# Patient Record
Sex: Female | Born: 1960 | Race: White | Hispanic: No | Smoking: Former smoker
Health system: Southern US, Community
[De-identification: ages and names within clinical notes are randomized; demographics above are authoritative.]

## PROBLEM LIST (undated history)

## (undated) DIAGNOSIS — C4491 Basal cell carcinoma of skin, unspecified: Secondary | ICD-10-CM

## (undated) DIAGNOSIS — C439 Malignant melanoma of skin, unspecified: Secondary | ICD-10-CM

## (undated) DIAGNOSIS — K219 Gastro-esophageal reflux disease without esophagitis: Secondary | ICD-10-CM

## (undated) HISTORY — DX: Gastro-esophageal reflux disease without esophagitis: K21.9

## (undated) HISTORY — DX: Basal cell carcinoma of skin, unspecified: C44.91

## (undated) HISTORY — PX: TONSILECTOMY/ADENOIDECTOMY WITH MYRINGOTOMY: SHX6125

## (undated) HISTORY — PX: TONSILLECTOMY: SUR1361

## (undated) HISTORY — DX: Malignant melanoma of skin, unspecified: C43.9

---

## 2010-11-14 LAB — HM COLONOSCOPY

## 2014-03-15 LAB — HM HEPATITIS C SCREENING LAB: HM Hepatitis Screen: NEGATIVE

## 2016-11-08 DIAGNOSIS — M8580 Other specified disorders of bone density and structure, unspecified site: Secondary | ICD-10-CM | POA: Insufficient documentation

## 2017-11-25 DIAGNOSIS — B001 Herpesviral vesicular dermatitis: Secondary | ICD-10-CM | POA: Insufficient documentation

## 2018-12-01 LAB — HM PAP SMEAR: HM Pap smear: NEGATIVE

## 2018-12-01 LAB — LIPID PANEL
Cholesterol: 203 — AB (ref 0–200)
HDL: 108 — AB (ref 35–70)
LDL Cholesterol: 78
Triglycerides: 83 (ref 40–160)

## 2018-12-01 LAB — TSH: TSH: 3.39 (ref <=5.90)

## 2018-12-01 LAB — HM HIV SCREENING LAB: HM HIV Screening: NEGATIVE

## 2018-12-01 LAB — HIV ANTIBODY (ROUTINE TESTING W REFLEX): HIV 1&2 Ab, 4th Generation: NEGATIVE

## 2018-12-01 LAB — RESULTS CONSOLE HPV: CHL HPV: NEGATIVE

## 2019-11-10 LAB — HM MAMMOGRAPHY

## 2020-03-30 LAB — HEPATIC FUNCTION PANEL
ALT: 39 — AB (ref 7–35)
AST: 37 — AB (ref 13–35)
Alkaline Phosphatase: 49 (ref 25–125)

## 2020-03-30 LAB — BASIC METABOLIC PANEL
BUN: 15 (ref 4–21)
CO2: 30 — AB (ref 13–22)
Chloride: 101 (ref 99–108)
Creatinine: 0.9 (ref 0.5–1.1)
Glucose: 83
Potassium: 3.8 (ref 3.4–5.3)
Sodium: 139 (ref 137–147)

## 2020-03-30 LAB — CBC AND DIFFERENTIAL
Hemoglobin: 12.7 (ref 12.0–16.0)
Platelets: 180 (ref 150–399)
WBC: 5.7

## 2020-03-30 LAB — COMPREHENSIVE METABOLIC PANEL
Calcium: 9.8 (ref 8.7–10.7)
GFR calc non Af Amer: 69

## 2020-10-05 ENCOUNTER — Encounter: Payer: Self-pay | Admitting: Podiatry

## 2020-10-05 ENCOUNTER — Ambulatory Visit: Payer: BLUE CROSS/BLUE SHIELD | Admitting: Podiatry

## 2020-10-05 ENCOUNTER — Other Ambulatory Visit: Payer: Self-pay

## 2020-10-05 ENCOUNTER — Ambulatory Visit: Payer: BLUE CROSS/BLUE SHIELD

## 2020-10-05 DIAGNOSIS — D2371 Other benign neoplasm of skin of right lower limb, including hip: Secondary | ICD-10-CM

## 2020-10-05 DIAGNOSIS — E559 Vitamin D deficiency, unspecified: Secondary | ICD-10-CM | POA: Insufficient documentation

## 2020-10-05 DIAGNOSIS — L603 Nail dystrophy: Secondary | ICD-10-CM | POA: Diagnosis not present

## 2020-10-05 DIAGNOSIS — D2372 Other benign neoplasm of skin of left lower limb, including hip: Secondary | ICD-10-CM | POA: Diagnosis not present

## 2020-10-05 DIAGNOSIS — C439 Malignant melanoma of skin, unspecified: Secondary | ICD-10-CM | POA: Insufficient documentation

## 2020-10-05 DIAGNOSIS — K2100 Gastro-esophageal reflux disease with esophagitis, without bleeding: Secondary | ICD-10-CM | POA: Insufficient documentation

## 2020-10-05 DIAGNOSIS — C4491 Basal cell carcinoma of skin, unspecified: Secondary | ICD-10-CM | POA: Insufficient documentation

## 2020-10-05 NOTE — Progress Notes (Signed)
  Subjective:  Patient ID: Andrea Schultz, female    DOB: Dec 20, 1960,  MRN: [id number] HPI Chief Complaint  Patient presents with   Foot Pain    Plantar feet bilateral - small, callused areas x years, used to have trimmed out by podiatrist, also 2nd toenail right is thick   New Patient (Initial Visit)    60 y.o. female presents with the above complaint.   ROS: Denies fever chills nausea vomiting muscle aches pains calf pain back pain chest pain shortness of breath.  No past medical history on file.  No current outpatient medications on file.  Allergies  Allergen Reactions   Sulfa Antibiotics Hives, Itching and Swelling   Review of Systems Objective:  There were no vitals filed for this visit.  General: Well developed, nourished, in no acute distress, alert and oriented x3   Dermatological: Skin is warm, dry and supple bilateral. Nails x 10 are well maintained; remaining integument appears unremarkable at this time. There are no open sores, no preulcerative lesions, no rash or signs of infection present.  Also demonstrates nail dystrophy to the second digit.  Vascular: Dorsalis Pedis artery and Posterior Tibial artery pedal pulses are 2/4 bilateral with immedate capillary fill time. Pedal hair growth present. No varicosities and no lower extremity edema present bilateral.   Neruologic: Grossly intact via light touch bilateral. Vibratory intact via tuning fork bilateral. Protective threshold with Semmes Wienstein monofilament intact to all pedal sites bilateral. Patellar and Achilles deep tendon reflexes 2+ bilateral. No Babinski or clonus noted bilateral.   Musculoskeletal: No gross boney pedal deformities bilateral. No pain, crepitus, or limitation noted with foot and ankle range of motion bilateral. Muscular strength 5/5 in all groups tested bilateral.  Gait: Unassisted, Nonantalgic.    Radiographs:  None taken  Assessment & Plan:   Assessment: Nail dystrophy and  painful benign skin lesions.  Plan: Debrided all benign skin lesions placed salicylic acid under occlusion to be left on for 3 days.  Also sampled toenail for fungus.     Adam Demary T. [place], [place]

## 2020-11-14 DIAGNOSIS — K219 Gastro-esophageal reflux disease without esophagitis: Secondary | ICD-10-CM | POA: Insufficient documentation

## 2020-12-14 ENCOUNTER — Other Ambulatory Visit: Payer: Self-pay

## 2020-12-14 ENCOUNTER — Encounter: Payer: Self-pay | Admitting: Internal Medicine

## 2020-12-14 ENCOUNTER — Ambulatory Visit: Payer: BLUE CROSS/BLUE SHIELD | Admitting: Internal Medicine

## 2020-12-14 VITALS — BP 122/70 | HR 71 | Ht 64.0 in | Wt 97.4 lb

## 2020-12-14 DIAGNOSIS — C439 Malignant melanoma of skin, unspecified: Secondary | ICD-10-CM

## 2020-12-14 DIAGNOSIS — L508 Other urticaria: Secondary | ICD-10-CM | POA: Diagnosis not present

## 2020-12-14 DIAGNOSIS — Z1231 Encounter for screening mammogram for malignant neoplasm of breast: Secondary | ICD-10-CM | POA: Diagnosis not present

## 2020-12-14 DIAGNOSIS — K2100 Gastro-esophageal reflux disease with esophagitis, without bleeding: Secondary | ICD-10-CM

## 2020-12-14 NOTE — Progress Notes (Signed)
Date:  12/14/2020   Name:  Andrea Schultz   DOB:  1960-08-20   MRN:  [id number]   Chief Complaint: New Patient (Initial Visit) and Pruritis (Had this issue last year around the same time. Seen Dermatology and PCP and they kept putting her on meds until the season ended. Has never had allergy testing to find out what this could be caused by. Itching and Hives.)  Urticaria This is a recurrent (has sx last year for about 6 months) problem. The current episode started 1 to 4 weeks ago. The problem has been gradually worsening since onset. The affected locations include the abdomen, back, left upper leg and right upper leg. The rash is characterized by itchiness, redness and swelling. She was exposed to nothing. Pertinent negatives include no anorexia, cough, diarrhea, facial edema, fatigue, fever, shortness of breath or vomiting. Past treatments include antihistamine (currently taking zyrtec). The treatment provided mild relief.   Lab Results  Component Value Date   CREATININE 0.9 03/30/2020   BUN 15 03/30/2020   NA 139 03/30/2020   K 3.8 03/30/2020   CL 101 03/30/2020   CO2 30 (A) 03/30/2020   Lab Results  Component Value Date   CHOL 203 (A) 12/01/2018   HDL 108 (A) 12/01/2018   LDLCALC 78 12/01/2018   TRIG 83 12/01/2018   Lab Results  Component Value Date   TSH 3.39 12/01/2018   No results found for: HGBA1C Lab Results  Component Value Date   WBC 5.7 03/30/2020   HGB 12.7 03/30/2020   PLT 180 03/30/2020   Lab Results  Component Value Date   ALT 39 (A) 03/30/2020   AST 37 (A) 03/30/2020   ALKPHOS 49 03/30/2020     Review of Systems  Constitutional:  Negative for chills, fatigue, fever and unexpected weight change.  Respiratory:  Negative for cough, chest tightness and shortness of breath.   Cardiovascular:  Negative for chest pain and leg swelling.  Gastrointestinal:  Negative for anorexia, diarrhea and vomiting.  Musculoskeletal:  Negative for arthralgias.  Skin:   Positive for color change and rash.  Neurological:  Negative for dizziness, light-headedness and headaches.  Psychiatric/Behavioral:  Positive for sleep disturbance (from itching). Negative for dysphoric mood. The patient is not nervous/anxious.    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   Osteopenia 11/08/2016    Allergies  Allergen Reactions   Sulfa Antibiotics Hives, Itching and Swelling    Past Surgical History:  Procedure Laterality Date   TONSILECTOMY/ADENOIDECTOMY WITH MYRINGOTOMY      Social History   Tobacco Use   Smoking status: Former    Packs/day: 0.25    Years: 0.50    Pack years: 0.13    Types: Cigarettes    Start date: 02/13/1983    Quit date: 07/14/1983    Years since quitting: 37.4   Smokeless tobacco: Never  Substance Use Topics   Alcohol use: Yes    Alcohol/week: 10.0 standard drinks    Types: 10 Standard drinks or equivalent per week   Drug use: Never     Medication list has been reviewed and updated.  Current Meds  Medication Sig   cetirizine (ZYRTEC) 10 MG tablet Take 10 mg by mouth 2 (two) times daily.   [DISCONTINUED] loratadine (ALLERGY) 10 MG tablet  Take 10 mg by mouth daily.    PHQ 2/9 Scores 12/14/2020  PHQ - 2 Score 0  PHQ- 9 Score 2    GAD 7 : Generalized Anxiety Score 12/14/2020  Nervous, Anxious, on Edge 0  Control/stop worrying 0  Worry too much - different things 0  Trouble relaxing 0  Restless 0  Easily annoyed or irritable 0  Afraid - awful might happen 0  Total GAD 7 Score 0  Anxiety Difficulty Not difficult at all    BP Readings from Last 3 Encounters:  12/14/20 122/70    Physical Exam Vitals and nursing note reviewed.  Constitutional:      General: She is not in acute distress.    Appearance: Normal appearance. She is  well-developed.  HENT:     Head: Normocephalic and atraumatic.  Neck:     Vascular: No carotid bruit.  Cardiovascular:     Rate and Rhythm: Normal rate and regular rhythm.     Pulses: Normal pulses.     Heart sounds: No murmur heard. Pulmonary:     Effort: Pulmonary effort is normal. No respiratory distress.     Breath sounds: No wheezing or rhonchi.  Abdominal:     General: Abdomen is flat.     Palpations: Abdomen is soft.  Musculoskeletal:     Cervical back: Normal range of motion.     Right lower leg: No edema.     Left lower leg: No edema.  Lymphadenopathy:     Cervical: No cervical adenopathy.  Skin:    General: Skin is warm and dry.     Findings: No rash.     Comments: Excoriations on back - no urticaria or other rash  Neurological:     Mental Status: She is alert and oriented to person, place, and time.  Psychiatric:        Mood and Affect: Mood normal.        Behavior: Behavior normal.    Wt Readings from Last 3 Encounters:  12/14/20 97 lb 6.4 oz (44.2 kg)    BP 122/70   Pulse 71   Ht 5\' 4"  (1.626 m)   Wt 97 lb 6.4 oz (44.2 kg)   SpO2 97%   BMI 16.72 kg/m   Assessment and Plan: 1. Recurrent urticaria Continue Zyrtec bid Will rule out Alpha-gal allergy and refer to Dr. Kathyrn Sheriff - Alpha-Gal Panel - Ambulatory referral to ENT  2. Skin cancer (melanoma) ([place]) Continue annual follow up with dermatology  3. Encounter for screening mammogram for breast cancer Schedule at [facility] - previously at [facility]   Partially dictated using Dragon software. Any errors are unintentional.  Halina Maidens, MD [facility]  12/14/2020

## 2020-12-21 LAB — ALPHA-GAL PANEL
Allergen Lamb IgE: <0.1 kU/L
Beef IgE: <0.1 kU/L
IgE (Immunoglobulin E), Serum: 58 IU/mL (ref 6–495)
O215-IgE Alpha-Gal: <0.1 kU/L
Pork IgE: <0.1 kU/L

## 2021-04-14 ENCOUNTER — Encounter: Payer: BLUE CROSS/BLUE SHIELD | Admitting: Internal Medicine

## 2021-05-01 ENCOUNTER — Other Ambulatory Visit: Payer: Self-pay

## 2021-05-01 ENCOUNTER — Ambulatory Visit
Admission: RE | Admit: 2021-05-01 | Discharge: 2021-05-01 | Disposition: A | Discharge status: Home / Self Care | Payer: BLUE CROSS/BLUE SHIELD | Source: Ambulatory Visit | Attending: Internal Medicine | Admitting: Internal Medicine

## 2021-05-01 DIAGNOSIS — Z1231 Encounter for screening mammogram for malignant neoplasm of breast: Secondary | ICD-10-CM | POA: Diagnosis present

## 2021-05-11 ENCOUNTER — Other Ambulatory Visit: Payer: Self-pay

## 2021-05-11 ENCOUNTER — Ambulatory Visit
Admission: RE | Admit: 2021-05-11 | Discharge: 2021-05-11 | Disposition: A | Discharge status: Home / Self Care | Payer: Self-pay | Source: Ambulatory Visit | Attending: *Deleted | Admitting: *Deleted

## 2021-05-11 ENCOUNTER — Other Ambulatory Visit: Payer: Self-pay | Admitting: *Deleted

## 2021-05-11 ENCOUNTER — Encounter: Payer: Self-pay | Admitting: Internal Medicine

## 2021-05-11 ENCOUNTER — Ambulatory Visit (INDEPENDENT_AMBULATORY_CARE_PROVIDER_SITE_OTHER): Payer: BLUE CROSS/BLUE SHIELD | Admitting: Internal Medicine

## 2021-05-11 VITALS — BP 126/84 | HR 74 | Ht 64.0 in | Wt 102.0 lb

## 2021-05-11 DIAGNOSIS — Z1231 Encounter for screening mammogram for malignant neoplasm of breast: Secondary | ICD-10-CM

## 2021-05-11 DIAGNOSIS — C439 Malignant melanoma of skin, unspecified: Secondary | ICD-10-CM

## 2021-05-11 DIAGNOSIS — Z1211 Encounter for screening for malignant neoplasm of colon: Secondary | ICD-10-CM | POA: Diagnosis not present

## 2021-05-11 DIAGNOSIS — E559 Vitamin D deficiency, unspecified: Secondary | ICD-10-CM

## 2021-05-11 DIAGNOSIS — Z0000 Encounter for general adult medical examination without abnormal findings: Secondary | ICD-10-CM | POA: Diagnosis not present

## 2021-05-11 DIAGNOSIS — K2100 Gastro-esophageal reflux disease with esophagitis, without bleeding: Secondary | ICD-10-CM

## 2021-05-11 MED ORDER — NA SULFATE-K SULFATE-MG SULF 17.5-3.13-1.6 GM/177ML PO SOLN: 1.0000 | Freq: Once | ORAL | 0 refills | Status: AC

## 2021-05-11 NOTE — Progress Notes (Signed)
? ? ?Date:  05/11/2021  ? ?Name:  Bisma Klett   DOB:  05/09/1960   MRN:  [id number] ? ? ?Chief Complaint: Annual Exam ?Rivka Baune is a 61 y.o. female who presents today for her Complete Annual Exam. She feels well. She reports exercising strength training, core work and cycling 5 days a week. She reports she is sleeping well. Breast complaints none. ? ?Mammogram: 2021 ?DEXA: none ?Pap smear: 11/2018 neg with co-testing ?Colonoscopy: 11/2010 - due ? ?Health Maintenance Due  ?Topic Date Due  ? Zoster Vaccines- Shingrix (1 of 2) Never done  ? COVID-19 Vaccine (3 - Moderna risk series) 01/22/2020  ? COLONOSCOPY (Pts 45-74yr Insurance coverage will need to be confirmed)  11/13/2020  ?  ?Immunization History  ?Administered Date(s) Administered  ? Moderna SARS-COV2 Booster Vaccination 12/25/2019  ? Moderna Sars-Covid-2 Vaccination 05/05/2019, 06/02/2019  ? Tdap 11/25/2006, 02/08/2017  ? ? ?Gastroesophageal Reflux ?hx gastritis on EGD 2015. Pertinent negatives include no fatigue. Risk factors include NSAIDs. She has tried a PPI for the symptoms. Past procedures include an EGD.  ?Urticaria - she has been better this year.  Having more sinus symptoms and taking Allegra and Mucinex.  Trying to vary her diet to avoid any possible food allergies. ? ?Lab Results  ?Component Value Date  ? NA 139 03/30/2020  ? K 3.8 03/30/2020  ? CO2 30 (A) 03/30/2020  ? BUN 15 03/30/2020  ? CREATININE 0.9 03/30/2020  ? CALCIUM 9.8 03/30/2020  ? GFRNONAA 69 03/30/2020  ? ?Lab Results  ?Component Value Date  ? CHOL 203 (A) 12/01/2018  ? HDL 108 (A) 12/01/2018  ? [place]78 12/01/2018  ? TRIG 83 12/01/2018  ? ?Lab Results  ?Component Value Date  ? TSH 3.39 12/01/2018  ? ?No results found for: HGBA1C ?Lab Results  ?Component Value Date  ? WBC 5.7 03/30/2020  ? HGB 12.7 03/30/2020  ? PLT 180 03/30/2020  ? ?Lab Results  ?Component Value Date  ? ALT 39 (A) 03/30/2020  ? AST 37 (A) 03/30/2020  ? ALKPHOS 49 03/30/2020  ? ?No results found for:  25OHVITD2, 2[place] VD25OH  ? ?Review of Systems  ?Constitutional:  Negative for chills, fatigue and fever.  ?HENT:  Negative for congestion, hearing loss, tinnitus, trouble swallowing and voice change.   ?Eyes:  Negative for visual disturbance.  ?Respiratory:  Negative for chest tightness and shortness of breath.   ?Cardiovascular:  Negative for palpitations and leg swelling.  ?Gastrointestinal:  Negative for constipation, diarrhea and vomiting.  ?Endocrine: Negative for polydipsia and polyuria.  ?Genitourinary:  Negative for dysuria, frequency, genital sores, vaginal bleeding and vaginal discharge.  ?Musculoskeletal:  Negative for arthralgias, gait problem and joint swelling.  ?Skin:  Negative for color change and rash.  ?Neurological:  Negative for dizziness, tremors, light-headedness and headaches.  ?Hematological:  Negative for adenopathy. Does not bruise/bleed easily.  ?Psychiatric/Behavioral:  Negative for dysphoric mood and sleep disturbance. The patient is not nervous/anxious.   ? ?Patient Active Problem List  ? Diagnosis Date Noted  ? Recurrent urticaria 12/14/2020  ? Basal cell carcinoma 10/05/2020  ? GERD with esophagitis 10/05/2020  ? [place]  ? Vitamin D deficiency 10/05/2020  ? 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 Osteopenia 11/08/2016  ? ? ?Allergies  ?Allergen Reactions  ? Sulfa Antibiotics Hives, Itching and Swelling  ? ? ?Past Surgical History:  ?Procedure Laterality Date  ? TONSILECTOMY/ADENOIDECTOMY WITH MYRINGOTOMY    ? ? ?Social History  ? ?Tobacco Use  ?  Smoking status: Former  ?  Packs/day: 0.25  ?  Years: 0.50  ?  Pack years: 0.13  ?  Types: Cigarettes  ?  Start date: 02/13/1983  ?  Quit date: 07/14/1983  ?  Years since quitting: 37.8  ? Smokeless tobacco: Never  ?Substance Use Topics  ? Alcohol use: Yes  ?  Alcohol/week: 10.0 standard drinks  ?  Types: 10 Standard drinks or equivalent per week  ? Drug use: Never  ? ? ? ?Medication list has been reviewed and  updated. ? ?Current Meds  ?Medication Sig  ? Fexofenadine HCl (ALLEGRA PO) Take by mouth in the morning and at bedtime.  ? guaiFENesin (MUCINEX PO) Take by mouth in the morning and at bedtime.  ? [DISCONTINUED] cetirizine (ZYRTEC) 10 MG tablet Take 10 mg by mouth 2 (two) times daily.  ? ? ? ?  05/11/2021  ?  9:18 AM 12/14/2020  ?  3:13 PM  ?GAD 7 : Generalized Anxiety Score  ?Nervous, Anxious, on Edge 0 0  ?Control/stop worrying 0 0  ?Worry too much - different things 0 0  ?Trouble relaxing 0 0  ?Restless 0 0  ?Easily annoyed or irritable 0 0  ?Afraid - awful might happen 0 0  ?Total GAD 7 Score 0 0  ?Anxiety Difficulty  Not difficult at all  ? ? ? ?  05/11/2021  ?  9:18 AM  ?Depression screen PHQ 2/9  ?Decreased Interest 0  ?Down, Depressed, Hopeless 0  ?PHQ - 2 Score 0  ?Altered sleeping 0  ?Tired, decreased energy 0  ?Change in appetite 0  ?Feeling bad or failure about yourself  0  ?Trouble concentrating 0  ?Moving slowly or fidgety/restless 0  ?Suicidal thoughts 0  ?PHQ-9 Score 0  ?Difficult doing work/chores Not difficult at all  ? ? ?BP Readings from Last 3 Encounters:  ?05/11/21 126/84  ?12/14/20 122/70  ? ? ?Physical Exam ?Vitals and nursing note reviewed.  ?Constitutional:   ?   General: She is not in acute distress. ?   Appearance: She is well-developed.  ?HENT:  ?   Head: Normocephalic and atraumatic.  ?   Right Ear: Tympanic membrane and ear canal normal.  ?   Left Ear: Tympanic membrane and ear canal normal.  ?   Nose:  ?   Right Sinus: No maxillary sinus tenderness.  ?   Left Sinus: No maxillary sinus tenderness.  ?Eyes:  ?   General: No scleral icterus.    ?   Right eye: No discharge.     ?   Left eye: No discharge.  ?   Conjunctiva/sclera: Conjunctivae normal.  ?Neck:  ?   Thyroid: No thyromegaly.  ?   Vascular: No carotid bruit.  ?Cardiovascular:  ?   Rate and Rhythm: Normal rate and regular rhythm.  ?   Pulses: Normal pulses.  ?   Heart sounds: Normal heart sounds.  ?Pulmonary:  ?   Effort: Pulmonary  effort is normal. No respiratory distress.  ?   Breath sounds: No wheezing.  ?Chest:  ?Breasts: ?   Right: No mass, nipple discharge, skin change or tenderness.  ?   Left: No mass, nipple discharge, skin change or tenderness.  ?Abdominal:  ?   General: Bowel sounds are normal.  ?   Palpations: Abdomen is soft.  ?   Tenderness: There is no abdominal tenderness.  ?Musculoskeletal:  ?   Cervical back: Normal range of motion. No erythema.  ?   Right  lower leg: No edema.  ?   Left lower leg: No edema.  ?Lymphadenopathy:  ?   Cervical: No cervical adenopathy.  ?Skin: ?   General: Skin is warm and dry.  ?   Capillary Refill: Capillary refill takes less than 2 seconds.  ?   Findings: No rash.  ?Neurological:  ?   General: No focal deficit present.  ?   Mental Status: She is alert and oriented to person, place, and time.  ?   Cranial Nerves: No cranial nerve deficit.  ?   Sensory: No sensory deficit.  ?   Deep Tendon Reflexes: Reflexes are normal and symmetric.  ?Psychiatric:     ?   Attention and Perception: Attention normal.     ?   Mood and Affect: Mood normal.  ? ? ?Wt Readings from Last 3 Encounters:  ?05/11/21 102 lb (46.3 kg)  ?12/14/20 97 lb 6.4 oz (44.2 kg)  ? ? ?BP 126/84   Pulse 74   Ht '5\' 4"'$  (1.626 m)   Wt 102 lb (46.3 kg)   SpO2 98%   BMI 17.51 kg/m?  ? ?Assessment and Plan: ?1. Annual physical exam ?Normal exam; continue regular exercise and healthy diet ?Schedule nurse visit for Shingrix if desired ?- Comprehensive metabolic panel ?- Hemoglobin A1c ?- Lipid panel ?- TSH ? ?2. Encounter for screening mammogram for breast cancer ?Recently done - waiting for comparison films ? ?3. Colon cancer screening ?Due for 10 yr screening ?- Ambulatory referral to Gastroenterology ? ?4. Vitamin D deficiency ?Will check level and advise ?- VITAMIN D 25 Hydroxy (Vit-D Deficiency, Fractures) ? ?5. Gastroesophageal reflux disease with esophagitis without hemorrhage ?Hx of EGD showing gastritis in 2015. ?Currently without  any gerd symptoms. ?- CBC with Differential/Platelet ? ?6. Skin cancer (melanoma) ([place]) ?Followed by [facility] ? ? ?Partially dictated using Editor, commissioning. Any errors are unintentional. ? ?Theone Murdoch

## 2021-05-11 NOTE — Progress Notes (Signed)
Gastroenterology Pre-Procedure Review ? ?Request Date: 06/30/2021 ?Requesting Physician: Dr. Vicente Males ? ?PATIENT REVIEW QUESTIONS: The patient responded to the following health history questions as indicated:   ? ?1. Are you having any GI issues? no ?2. Do you have a personal history of Polyps? no ?3. Do you have a family history of Colon Cancer or Polyps? no ?4. Diabetes Mellitus? no ?5. Joint replacements in the past 12 months?no ?6. Major health problems in the past 3 months?no ?7. Any artificial heart valves, MVP, or defibrillator?no ?   ?MEDICATIONS & ALLERGIES:    ?Patient reports the following regarding taking any anticoagulation/antiplatelet therapy:   ?Plavix, Coumadin, Eliquis, Xarelto, Lovenox, Pradaxa, Brilinta, or Effient? no ?Aspirin? no ? ?Patient confirms/reports the following medications:  ?Current Outpatient Medications  ?Medication Sig Dispense Refill  ? Fexofenadine HCl (ALLEGRA PO) Take by mouth in the morning and at bedtime.    ? guaiFENesin (MUCINEX PO) Take by mouth in the morning and at bedtime.    ? ?No current facility-administered medications for this visit.  ? ? ?Patient confirms/reports the following allergies:  ?Allergies  ?Allergen Reactions  ? Sulfa Antibiotics Hives, Itching and Swelling  ? ? ?No orders of the defined types were placed in this encounter. ? ? ?AUTHORIZATION INFORMATION ?Primary Insurance: ?1D#: ?Group #: ? ?Secondary Insurance: ?1D#: ?Group #: ? ?SCHEDULE INFORMATION: ?Date: 06/30/2021 ?Time: ?Location:[facility] ? ?

## 2021-05-12 ENCOUNTER — Ambulatory Visit (INDEPENDENT_AMBULATORY_CARE_PROVIDER_SITE_OTHER): Payer: BLUE CROSS/BLUE SHIELD

## 2021-05-12 DIAGNOSIS — Z23 Encounter for immunization: Secondary | ICD-10-CM

## 2021-05-12 LAB — LIPID PANEL
Chol/HDL Ratio: 2 ratio (ref 0.0–4.4)
Cholesterol, Total: 245 mg/dL — ABNORMAL HIGH (ref 100–199)
HDL: 123 mg/dL (ref >39)
LDL Chol Calc (NIH): 113 mg/dL — ABNORMAL HIGH (ref 0–99)
Triglycerides: 55 mg/dL (ref 0–149)
VLDL Cholesterol Cal: 9 mg/dL (ref 5–40)

## 2021-05-12 LAB — CBC WITH DIFFERENTIAL/PLATELET
Basophils Absolute: 0.1 10*3/uL (ref 0.0–0.2)
Basos: 1 %
EOS (ABSOLUTE): 0.1 10*3/uL (ref 0.0–0.4)
Eos: 1 %
Hematocrit: 44.7 % (ref 34.0–46.6)
Hemoglobin: 14.8 g/dL (ref 11.1–15.9)
Immature Grans (Abs): 0 10*3/uL (ref 0.0–0.1)
Immature Granulocytes: 0 %
Lymphocytes Absolute: 1.4 10*3/uL (ref 0.7–3.1)
Lymphs: 22 %
MCH: 29.3 pg (ref 26.6–33.0)
MCHC: 33.1 g/dL (ref 31.5–35.7)
MCV: 89 fL (ref 79–97)
Monocytes Absolute: 0.4 10*3/uL (ref 0.1–0.9)
Monocytes: 6 %
Neutrophils Absolute: 4.4 10*3/uL (ref 1.4–7.0)
Neutrophils: 70 %
Platelets: 202 10*3/uL (ref 150–450)
RBC: 5.05 x10E6/uL (ref 3.77–5.28)
RDW: 11.8 % (ref 11.7–15.4)
WBC: 6.3 10*3/uL (ref 3.4–10.8)

## 2021-05-12 LAB — COMPREHENSIVE METABOLIC PANEL
ALT: 19 IU/L (ref 0–32)
AST: 29 IU/L (ref 0–40)
Albumin/Globulin Ratio: 2 (ref 1.2–2.2)
Albumin: 4.5 g/dL (ref 3.8–4.8)
Alkaline Phosphatase: 81 IU/L (ref 44–121)
BUN/Creatinine Ratio: 13 (ref 12–28)
BUN: 12 mg/dL (ref 8–27)
Bilirubin Total: 0.6 mg/dL (ref 0.0–1.2)
CO2: 26 mmol/L (ref 20–29)
Calcium: 9.7 mg/dL (ref 8.7–10.3)
Chloride: 100 mmol/L (ref 96–106)
Creatinine, Ser: 0.96 mg/dL (ref 0.57–1.00)
Globulin, Total: 2.3 g/dL (ref 1.5–4.5)
Glucose: 94 mg/dL (ref 70–99)
Potassium: 4.6 mmol/L (ref 3.5–5.2)
Sodium: 139 mmol/L (ref 134–144)
Total Protein: 6.8 g/dL (ref 6.0–8.5)
eGFR: 67 mL/min/{1.73_m2} (ref >59)

## 2021-05-12 LAB — VITAMIN D 25 HYDROXY (VIT D DEFICIENCY, FRACTURES): Vit D, 25-Hydroxy: 24.7 ng/mL — ABNORMAL LOW (ref 30.0–100.0)

## 2021-05-12 LAB — TSH: TSH: 2.76 u[IU]/mL (ref 0.450–4.500)

## 2021-05-12 LAB — HEMOGLOBIN A1C
Est. average glucose Bld gHb Est-mCnc: 108 mg/dL
Hgb A1c MFr Bld: 5.4 % (ref 4.8–5.6)

## 2021-06-13 ENCOUNTER — Telehealth: Payer: Self-pay

## 2021-06-13 ENCOUNTER — Telehealth: Payer: Self-pay | Admitting: Gastroenterology

## 2021-06-13 NOTE — Telephone Encounter (Signed)
Rescheduled patient till 07/14/2021 called endo spoke with [facility] and sent new referral and letters out ?

## 2021-06-13 NOTE — Telephone Encounter (Signed)
Pt has procedure for 5/19  and would like a call back to reschedule ? ? ? ? ? ? ? ?

## 2021-06-30 ENCOUNTER — Ambulatory Visit: Admit: 2021-06-30 | Payer: BLUE CROSS/BLUE SHIELD | Admitting: Gastroenterology

## 2021-06-30 SURGERY — COLONOSCOPY WITH PROPOFOL
Anesthesia: General

## 2021-07-14 ENCOUNTER — Ambulatory Visit: Payer: BLUE CROSS/BLUE SHIELD | Admitting: Registered Nurse

## 2021-07-14 ENCOUNTER — Ambulatory Visit
Admission: RE | Admit: 2021-07-14 | Discharge: 2021-07-14 | Disposition: A | Discharge status: Home / Self Care | Payer: BLUE CROSS/BLUE SHIELD | Attending: Gastroenterology | Admitting: Gastroenterology

## 2021-07-14 ENCOUNTER — Encounter
Admission: RE | Disposition: A | Discharge status: Home / Self Care | Payer: Self-pay | Source: Home / Self Care | Attending: Gastroenterology

## 2021-07-14 ENCOUNTER — Encounter: Payer: Self-pay | Admitting: Gastroenterology

## 2021-07-14 DIAGNOSIS — Z87891 Personal history of nicotine dependence: Secondary | ICD-10-CM | POA: Insufficient documentation

## 2021-07-14 DIAGNOSIS — K219 Gastro-esophageal reflux disease without esophagitis: Secondary | ICD-10-CM | POA: Insufficient documentation

## 2021-07-14 DIAGNOSIS — Z1211 Encounter for screening for malignant neoplasm of colon: Secondary | ICD-10-CM | POA: Diagnosis present

## 2021-07-14 DIAGNOSIS — D120 Benign neoplasm of cecum: Secondary | ICD-10-CM | POA: Insufficient documentation

## 2021-07-14 DIAGNOSIS — K635 Polyp of colon: Secondary | ICD-10-CM | POA: Diagnosis not present

## 2021-07-14 HISTORY — PX: COLONOSCOPY WITH PROPOFOL: SHX5780

## 2021-07-14 SURGERY — COLONOSCOPY WITH PROPOFOL
Anesthesia: General

## 2021-07-14 MED ORDER — SODIUM CHLORIDE 0.9 % IV SOLN
INTRAVENOUS | Status: DC
  Administered 2021-07-14: 1000 mL via INTRAVENOUS

## 2021-07-14 MED ORDER — DEXMEDETOMIDINE HCL 200 MCG/2ML IV SOLN
INTRAVENOUS | Status: DC | PRN
  Administered 2021-07-14: 6 ug via INTRAVENOUS
  Administered 2021-07-14: 10 ug via INTRAVENOUS

## 2021-07-14 MED ORDER — GLYCOPYRROLATE 0.2 MG/ML IJ SOLN
INTRAMUSCULAR | Status: DC | PRN
  Administered 2021-07-14: .1 mg via INTRAVENOUS

## 2021-07-14 MED ORDER — PROPOFOL 10 MG/ML IV BOLUS
INTRAVENOUS | Status: DC | PRN
  Administered 2021-07-14: 80 mg via INTRAVENOUS
  Administered 2021-07-14 (×2): 40 mg via INTRAVENOUS

## 2021-07-14 MED ORDER — GLYCOPYRROLATE 0.2 MG/ML IJ SOLN
INTRAMUSCULAR | Status: AC
  Filled 2021-07-14: qty 1

## 2021-07-14 MED ORDER — LIDOCAINE HCL (CARDIAC) PF 100 MG/5ML IV SOSY
PREFILLED_SYRINGE | INTRAVENOUS | Status: DC | PRN
  Administered 2021-07-14: 100 mg via INTRAVENOUS

## 2021-07-14 MED ORDER — PROPOFOL 500 MG/50ML IV EMUL
INTRAVENOUS | Status: DC | PRN
  Administered 2021-07-14: 175 ug/kg/min via INTRAVENOUS

## 2021-07-14 MED ORDER — PROPOFOL 1000 MG/100ML IV EMUL
INTRAVENOUS | Status: AC
  Filled 2021-07-14: qty 100

## 2021-07-14 NOTE — Op Note (Signed)
[facility] Gastroenterology Patient Name: Andrea Schultz Procedure Date: 07/14/2021 9:43 AM MRN: [id number] Account #: [id number] Date of Birth: Jun 30, 1960 Admit Type: Outpatient Age: 61 Room: [facility] ENDO ROOM 4 Gender: Female Note Status: Finalized Instrument Name: Park Meo [id number] Procedure:             Colonoscopy Indications:           Screening for colorectal malignant neoplasm Providers:             Jonathon Bellows MD, MD Referring MD:          Halina Maidens, MD (Referring MD) Medicines:             Monitored Anesthesia Care Complications:         No immediate complications. Procedure:             Pre-Anesthesia Assessment:                        - Prior to the procedure, a History and Physical was                         performed, and patient medications, allergies and                         sensitivities were reviewed. The patient's tolerance                         of previous anesthesia was reviewed.                        - The risks and benefits of the procedure and the                         sedation options and risks were discussed with the                         patient. All questions were answered and informed                         consent was obtained.                        - ASA Grade Assessment: II - A patient with mild                         systemic disease.                        After obtaining informed consent, the colonoscope was                         passed under direct vision. Throughout the procedure,                         the patient's blood pressure, pulse, and oxygen                         saturations were monitored continuously. The                         Colonoscope was introduced  through the anus and                         advanced to the the cecum, identified by the                         appendiceal orifice. The colonoscopy was performed                         with ease. The patient tolerated the procedure well.                          The quality of the bowel preparation was excellent. Findings:      The perianal and digital rectal examinations were normal.      A 8 mm polyp was found in the cecum. The polyp was sessile. The polyp       was removed with a cold snare. Resection and retrieval were complete.      The exam was otherwise without abnormality on direct and retroflexion       views. Impression:            - One 8 mm polyp in the cecum, removed with a cold                         snare. Resected and retrieved.                        - The examination was otherwise normal on direct and                         retroflexion views. Recommendation:        - Discharge patient to home (with escort).                        - Resume previous diet.                        - Continue present medications.                        - Await pathology results.                        - Repeat colonoscopy for surveillance based on                         pathology results. Procedure Code(s):     --- Professional ---                        [phone], Colonoscopy, flexible; with removal of                         tumor(s), polyp(s), or other lesion(s) by snare                         technique Diagnosis Code(s):     --- Professional ---                        Z12.11, Encounter for screening for malignant neoplasm  of colon                        K63.5, Polyp of colon CPT copyright 2019 American Medical Association. All rights reserved. The codes documented in this report are preliminary and upon coder review may  be revised to meet current compliance requirements. Jonathon Bellows, MD Jonathon Bellows MD, MD 07/14/2021 10:07:56 AM This report has been signed electronically. Number of Addenda: 0 Note Initiated On: 07/14/2021 9:43 AM Scope Withdrawal Time: 0 hours 10 minutes 50 seconds  Total Procedure Duration: 0 hours 16 minutes 39 seconds  Estimated Blood Loss:  Estimated blood loss: none.       [facility]

## 2021-07-14 NOTE — Transfer of Care (Signed)
Immediate Anesthesia Transfer of Care Note  Patient: Andrea Schultz  Procedure(s) Performed: COLONOSCOPY WITH PROPOFOL  Patient Location: Endoscopy Unit  Anesthesia Type:General  Level of Consciousness: drowsy  Airway & Oxygen Therapy: Patient Spontanous Breathing  Post-op Assessment: Report given to RN and Post -op Vital signs reviewed and stable  Post vital signs: Reviewed and stable  Last Vitals:  Vitals Value Taken Time  BP 94/40 07/14/21 1009  Temp    Pulse 72 07/14/21 1010  Resp 18 07/14/21 1010  SpO2 99 % 07/14/21 1010  Vitals shown include unvalidated device data.  Last Pain:  Vitals:   07/14/21 0914  TempSrc: Temporal  PainSc: 0-No pain         Complications: No notable events documented.

## 2021-07-14 NOTE — Anesthesia Postprocedure Evaluation (Signed)
Anesthesia Post Note  Patient: Andrea Schultz  Procedure(s) Performed: COLONOSCOPY WITH PROPOFOL  Patient location during evaluation: Endoscopy Anesthesia Type: General Level of consciousness: awake and alert Pain management: pain level controlled Vital Signs Assessment: post-procedure vital signs reviewed and stable Respiratory status: spontaneous breathing, nonlabored ventilation and respiratory function stable Cardiovascular status: blood pressure returned to baseline and stable Postop Assessment: no apparent nausea or vomiting Anesthetic complications: no   No notable events documented.   Last Vitals:  Vitals:   07/14/21 1019 07/14/21 1029  BP: (!) 92/45 (!) 95/51  Pulse: (!) 59 (!) 57  Resp: 14 17  Temp:    SpO2: 100% 100%    Last Pain:  Vitals:   07/14/21 1029  TempSrc:   PainSc: 0-No pain                 [place] Ouch

## 2021-07-14 NOTE — H&P (Signed)
Andrea Bellows, MD [address] [place], [place], [place], [id number], [place], [place], [place], [address] Phone: [phone]  Fax: [phone]  Primary Care Physician:  Glean Hess, MD   Pre-Procedure History & Physical: HPI:  Andrea Schultz is a 61 y.o. female is here for an colonoscopy.   Past Medical History:  Diagnosis Date   Basal cell carcinoma    GERD (gastroesophageal reflux disease)    Skin cancer (melanoma) (HCC)     Past Surgical History:  Procedure Laterality Date   TONSILECTOMY/ADENOIDECTOMY WITH MYRINGOTOMY     TONSILLECTOMY      Prior to Admission medications   Medication Sig Start Date End Date Taking? Authorizing Provider  Fexofenadine HCl (ALLEGRA PO) Take by mouth in the morning and at bedtime.    [provider]  guaiFENesin (MUCINEX PO) Take by mouth in the morning and at bedtime.    [provider]    Allergies as of 06/13/2021 - Review Complete 05/11/2021  Allergen Reaction Noted   Sulfa antibiotics Hives, Itching, and Swelling 03/15/2014    Family History  Problem Relation Age of Onset   Heart defect Father    Esophageal cancer Maternal Aunt    Breast cancer Maternal Grandmother     Social History   Socioeconomic History   Marital status: Divorced    Spouse name: Not on file   Number of children: 2   Years of education: Not on file   Highest education level: Not on file  Occupational History   Not on file  Tobacco Use   Smoking status: Former    Packs/day: 0.25    Years: 0.50    Pack years: 0.13    Types: Cigarettes    Start date: 02/13/1983    Quit date: 07/14/1983    Years since quitting: 38.0   Smokeless tobacco: Never  Vaping Use   Vaping Use: Never used  Substance and Sexual Activity   Alcohol use: Yes    Alcohol/week: 10.0 standard drinks    Types: 10 Standard drinks or equivalent per week   Drug use: Never   Sexual activity: Not Currently  Other Topics Concern   Not on  file  Social History Narrative   Not on file   Social Determinants of Health   Financial Resource Strain: Not on file  Food Insecurity: Not on file  Transportation Needs: Not on file  Physical Activity: Not on file  Stress: Not on file  Social Connections: Not on file  Intimate Partner Violence: Not on file    Review of Systems: See HPI, otherwise negative ROS  Physical Exam: BP (!) 125/52   Pulse 62   Temp (!) 96.3 F (35.7 C) (Temporal)   Resp 16   Ht '5\' 4"'$  (1.626 m)   Wt 43.2 kg   SpO2 98%   BMI 16.34 kg/m  General:   Alert,  pleasant and cooperative in NAD Head:  Normocephalic and atraumatic. Neck:  Supple; no masses or thyromegaly. Lungs:  Clear throughout to auscultation, normal respiratory effort.    Heart:  +S1, +S2, Regular rate and rhythm, No edema. Abdomen:  Soft, nontender and nondistended. Normal bowel sounds, without guarding, and without rebound.   Neurologic:  Alert and  oriented x4;  grossly normal neurologically.  Impression/Plan: [name] is here for an colonoscopy to be performed for Screening colonoscopy average risk   Risks, benefits, limitations, and alternatives regarding  colonoscopy have been reviewed with the  patient.  Questions have been answered.  All parties agreeable.   Andrea Bellows, MD  07/14/2021, 9:28 AM

## 2021-07-14 NOTE — Anesthesia Preprocedure Evaluation (Addendum)
Anesthesia Evaluation  Patient identified by MRN, date of birth, ID band Patient awake    Reviewed: Allergy & Precautions, NPO status , Patient's Chart, lab work & pertinent test results  Airway Mallampati: III  TM Distance: >3 FB Neck ROM: full    Dental  (+) Chipped   Pulmonary neg pulmonary ROS, former smoker,    Pulmonary exam normal        Cardiovascular negative cardio ROS Normal cardiovascular exam     Neuro/Psych negative neurological ROS  negative psych ROS   GI/Hepatic negative GI ROS, Neg liver ROS,   Endo/Other  negative endocrine ROS  Renal/GU negative Renal ROS  negative genitourinary   Musculoskeletal   Abdominal Normal abdominal exam  (+)   Peds  Hematology negative hematology ROS (+)   Anesthesia Other Findings Past Medical History: No date: Basal cell carcinoma No date: GERD (gastroesophageal reflux disease) No date: Skin cancer (melanoma) (HCC)  Past Surgical History: No date: TONSILECTOMY/ADENOIDECTOMY WITH MYRINGOTOMY     Reproductive/Obstetrics negative OB ROS                            Anesthesia Physical Anesthesia Plan  ASA: 2  Anesthesia Plan: General   Post-op Pain Management:    Induction: Intravenous  PONV Risk Score and Plan: Propofol infusion and TIVA  Airway Management Planned: Nasal Cannula and Natural Airway  Additional Equipment:   Intra-op Plan:   Post-operative Plan:   Informed Consent: I have reviewed the patients History and Physical, chart, labs and discussed the procedure including the risks, benefits and alternatives for the proposed anesthesia with the patient or authorized representative who has indicated his/her understanding and acceptance.     Dental Advisory Given  Plan Discussed with: Anesthesiologist, CRNA and Surgeon  Anesthesia Plan Comments:        Anesthesia Quick Evaluation

## 2021-07-15 ENCOUNTER — Encounter: Payer: Self-pay | Admitting: Gastroenterology

## 2021-07-18 LAB — SURGICAL PATHOLOGY

## 2021-07-24 ENCOUNTER — Encounter: Payer: Self-pay | Admitting: Gastroenterology

## 2021-07-24 NOTE — Progress Notes (Signed)
7 years 

## 2021-08-11 ENCOUNTER — Ambulatory Visit (INDEPENDENT_AMBULATORY_CARE_PROVIDER_SITE_OTHER): Payer: BLUE CROSS/BLUE SHIELD

## 2021-08-11 DIAGNOSIS — Z23 Encounter for immunization: Secondary | ICD-10-CM

## 2022-05-01 ENCOUNTER — Other Ambulatory Visit: Payer: Self-pay | Admitting: Internal Medicine

## 2022-05-01 DIAGNOSIS — Z1231 Encounter for screening mammogram for malignant neoplasm of breast: Secondary | ICD-10-CM

## 2022-05-15 ENCOUNTER — Encounter: Payer: Self-pay | Admitting: Internal Medicine

## 2022-05-15 ENCOUNTER — Ambulatory Visit (INDEPENDENT_AMBULATORY_CARE_PROVIDER_SITE_OTHER): Payer: BC Managed Care – PPO | Admitting: Internal Medicine

## 2022-05-15 VITALS — BP 110/70 | HR 66 | Ht 64.0 in | Wt 103.0 lb

## 2022-05-15 DIAGNOSIS — E559 Vitamin D deficiency, unspecified: Secondary | ICD-10-CM | POA: Diagnosis not present

## 2022-05-15 DIAGNOSIS — Z0000 Encounter for general adult medical examination without abnormal findings: Secondary | ICD-10-CM

## 2022-05-15 DIAGNOSIS — Z1231 Encounter for screening mammogram for malignant neoplasm of breast: Secondary | ICD-10-CM | POA: Diagnosis not present

## 2022-05-15 DIAGNOSIS — E785 Hyperlipidemia, unspecified: Secondary | ICD-10-CM | POA: Diagnosis not present

## 2022-05-15 DIAGNOSIS — C439 Malignant melanoma of skin, unspecified: Secondary | ICD-10-CM

## 2022-05-15 NOTE — Assessment & Plan Note (Signed)
Last level 24. Recommend vitamin D 1000 IU daily

## 2022-05-15 NOTE — Assessment & Plan Note (Signed)
Followed regularly by Dermatology. 

## 2022-05-15 NOTE — Progress Notes (Signed)
Date:  05/15/2022   Name:  Andrea Schultz   DOB:  [birth year]   MRN:  [id number]   Chief Complaint: Annual Exam Andrea Schultz is a 62 y.o. female who presents today for her Complete Annual Exam. She feels well. She reports exercising. She reports she is sleeping well. Breast complaints - none. She has been more tired lately - working harder and longer hours.  Diet is junk food, salsa/chips - rarely cooks for herself.  Mammogram: 04/2021 - scheduled DEXA: none Pap smear: 11/2018 neg/neg Colonoscopy: 07/2021 repeat 10 yrs  Health Maintenance Due  Topic Date Due   COVID-19 Vaccine (3 - Moderna risk series) 01/22/2020    Immunization History  Administered Date(s) Administered   Moderna SARS-COV2 Booster Vaccination 12/25/2019   Moderna Sars-Covid-2 Vaccination 05/05/2019, 06/02/2019   Tdap 11/25/2006, 02/08/2017   Zoster Recombinat (Shingrix) 05/12/2021, 08/11/2021    HPI  Lab Results  Component Value Date   NA 139 05/11/2021   K 4.6 05/11/2021   CO2 26 05/11/2021   GLUCOSE 94 05/11/2021   BUN 12 05/11/2021   CREATININE 0.96 05/11/2021   CALCIUM 9.7 05/11/2021   EGFR 67 05/11/2021   GFRNONAA 69 03/30/2020   Lab Results  Component Value Date   CHOL 245 (H) 05/11/2021   HDL 123 05/11/2021   LDLCALC 113 (H) 05/11/2021   TRIG 55 05/11/2021   CHOLHDL 2.0 05/11/2021   Lab Results  Component Value Date   TSH 2.760 05/11/2021   Lab Results  Component Value Date   HGBA1C 5.4 05/11/2021   Lab Results  Component Value Date   WBC 6.3 05/11/2021   HGB 14.8 05/11/2021   HCT 44.7 05/11/2021   MCV 89 05/11/2021   PLT 202 05/11/2021   Lab Results  Component Value Date   ALT 19 05/11/2021   AST 29 05/11/2021   ALKPHOS 81 05/11/2021   BILITOT 0.6 05/11/2021   Lab Results  Component Value Date   VD25OH 24.7 (L) 05/11/2021     Review of Systems  Constitutional:  Negative for chills, fatigue and fever.  HENT:  Negative for congestion, hearing loss, tinnitus,  trouble swallowing and voice change.   Eyes:  Negative for visual disturbance.  Respiratory:  Negative for cough, chest tightness, shortness of breath and wheezing.   Cardiovascular:  Negative for chest pain, palpitations and leg swelling.  Gastrointestinal:  Negative for abdominal pain, constipation, diarrhea and vomiting.  Endocrine: Negative for polydipsia and polyuria.  Genitourinary:  Negative for dysuria, frequency, genital sores, vaginal bleeding and vaginal discharge.  Musculoskeletal:  Negative for arthralgias, gait problem and joint swelling.  Skin:  Negative for color change and rash.  Neurological:  Negative for dizziness, tremors, light-headedness and headaches.  Hematological:  Negative for adenopathy. Does not bruise/bleed easily.  Psychiatric/Behavioral:  Negative for dysphoric mood and sleep disturbance. The patient is not nervous/anxious.     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   Osteopenia 11/08/2016    Allergies  Allergen Reactions   Sulfa Antibiotics Hives, Itching and Swelling    Past Surgical History:  Procedure Laterality Date   COLONOSCOPY WITH PROPOFOL N/A 07/14/2021   Procedure: COLONOSCOPY WITH PROPOFOL;  Surgeon: Jonathon Bellows, MD;  Location: [facility] ENDOSCOPY;  Service: Gastroenterology;  Laterality: N/A;  TONSILECTOMY/ADENOIDECTOMY WITH MYRINGOTOMY     TONSILLECTOMY      Social History   Tobacco Use   Smoking status: Former    Packs/day: 0.25    Years: 0.50    Additional pack years: 0.00    Total pack years: 0.13    Types: Cigarettes    Start date: 02/13/1983    Quit date: 07/14/1983    Years since quitting: 38.8   Smokeless tobacco: Never  Vaping Use   Vaping Use: Never used  Substance Use Topics   Alcohol use: Yes     Alcohol/week: 10.0 standard drinks of alcohol    Types: 10 Standard drinks or equivalent per week   Drug use: Never     Medication list has been reviewed and updated.  Current Meds  Medication Sig   Fexofenadine HCl (ALLEGRA PO) Take by mouth in the morning and at bedtime.   guaiFENesin (MUCINEX PO) Take by mouth in the morning and at bedtime.       05/15/2022    8:00 AM 05/11/2021    9:18 AM 12/14/2020    3:13 PM  GAD 7 : Generalized Anxiety Score  Nervous, Anxious, on Edge 0 0 0  Control/stop worrying 0 0 0  Worry too much - different things 0 0 0  Trouble relaxing 0 0 0  Restless 0 0 0  Easily annoyed or irritable 0 0 0  Afraid - awful might happen 0 0 0  Total GAD 7 Score 0 0 0  Anxiety Difficulty Not difficult at all  Not difficult at all       05/15/2022    7:59 AM 05/11/2021    9:18 AM 12/14/2020    3:13 PM  Depression screen PHQ 2/9  Decreased Interest 0 0 0  Down, Depressed, Hopeless 0 0 0  PHQ - 2 Score 0 0 0  Altered sleeping 0 0 1  Tired, decreased energy 2 0 1  Change in appetite 0 0 0  Feeling bad or failure about yourself  0 0 0  Trouble concentrating 1 0 0  Moving slowly or fidgety/restless 0 0 0  Suicidal thoughts 0 0 0  PHQ-9 Score 3 0 2  Difficult doing work/chores Not difficult at all Not difficult at all Not difficult at all    BP Readings from Last 3 Encounters:  05/15/22 110/70  07/14/21 (!) 95/51  05/11/21 126/84    Physical Exam Vitals and nursing note reviewed.  Constitutional:      General: She is not in acute distress.    Appearance: She is well-developed.  HENT:     Head: Normocephalic and atraumatic.     Right Ear: Tympanic membrane and ear canal normal.     Left Ear: Tympanic membrane and ear canal normal.     Nose:     Right Sinus: No maxillary sinus tenderness.     Left Sinus: No maxillary sinus tenderness.  Eyes:     General: No scleral icterus.       Right eye: No discharge.        Left eye: No discharge.      Conjunctiva/sclera: Conjunctivae normal.  Neck:     Thyroid: No thyromegaly.     Vascular: No carotid bruit.  Cardiovascular:     Rate and Rhythm: Normal rate and regular rhythm.     Pulses: Normal pulses.     Heart sounds: Normal heart sounds.  Pulmonary:     Effort: Pulmonary effort is normal. No  respiratory distress.     Breath sounds: No wheezing.  Chest:  Breasts:    Right: Inverted nipple present. No mass, nipple discharge, skin change or tenderness.     Left: Inverted nipple present. No mass, nipple discharge, skin change or tenderness.  Abdominal:     General: Bowel sounds are normal.     Palpations: Abdomen is soft.     Tenderness: There is no abdominal tenderness.  Musculoskeletal:     Cervical back: Normal range of motion. No erythema.     Right lower leg: No edema.     Left lower leg: No edema.  Lymphadenopathy:     Cervical: No cervical adenopathy.  Skin:    General: Skin is warm and dry.     Findings: No rash.  Neurological:     General: No focal deficit present.     Mental Status: She is alert and oriented to person, place, and time.     Cranial Nerves: No cranial nerve deficit.     Sensory: No sensory deficit.     Deep Tendon Reflexes: Reflexes are normal and symmetric.  Psychiatric:        Attention and Perception: Attention normal.        Mood and Affect: Mood normal.     Wt Readings from Last 3 Encounters:  05/15/22 103 lb (46.7 kg)  07/14/21 95 lb 3.1 oz (43.2 kg)  05/11/21 102 lb (46.3 kg)    BP 110/70   Pulse 66   Ht 5\' 4"  (1.626 m)   Wt 103 lb (46.7 kg)   SpO2 99%   BMI 17.68 kg/m   Assessment and Plan:  Problem List Items Addressed This Visit       Musculoskeletal and Integument   Skin cancer (melanoma)    Followed regularly by Dermatology        Other   Vitamin D deficiency    Last level 24. Recommend vitamin D 1000 IU daily      Relevant Orders   VITAMIN D 25 Hydroxy (Vit-D Deficiency, Fractures)   Other Visit  Diagnoses     Annual physical exam    -  Primary   Encourage more protein,fruits and vegetables in her diet she declines flu vaccine   Relevant Orders   CBC with Differential/Platelet   Comprehensive metabolic panel   Lipid panel   VITAMIN D 25 Hydroxy (Vit-D Deficiency, Fractures)   TSH   Encounter for screening mammogram for breast cancer       Scheduled for next week   Mild hyperlipidemia       Relevant Orders   Lipid panel       Return in about 1 year (around 05/15/2023) for CPX.   Partially dictated using [place], any errors are not intentional.  Glean Hess, MD [place], [place]

## 2022-05-16 LAB — LIPID PANEL
Chol/HDL Ratio: 2.1 ratio (ref 0.0–4.4)
Cholesterol, Total: 234 mg/dL — ABNORMAL HIGH (ref 100–199)
HDL: 114 mg/dL (ref >39)
LDL Chol Calc (NIH): 106 mg/dL — ABNORMAL HIGH (ref 0–99)
Triglycerides: 84 mg/dL (ref 0–149)
VLDL Cholesterol Cal: 14 mg/dL (ref 5–40)

## 2022-05-16 LAB — CBC WITH DIFFERENTIAL/PLATELET
Basophils Absolute: 0.1 10*3/uL (ref 0.0–0.2)
Basos: 1 %
EOS (ABSOLUTE): 0.1 10*3/uL (ref 0.0–0.4)
Eos: 3 %
Hematocrit: 40.7 % (ref 34.0–46.6)
Hemoglobin: 14 g/dL (ref 11.1–15.9)
Immature Grans (Abs): 0 10*3/uL (ref 0.0–0.1)
Immature Granulocytes: 0 %
Lymphocytes Absolute: 1.3 10*3/uL (ref 0.7–3.1)
Lymphs: 34 %
MCH: 29.5 pg (ref 26.6–33.0)
MCHC: 34.4 g/dL (ref 31.5–35.7)
MCV: 86 fL (ref 79–97)
Monocytes Absolute: 0.4 10*3/uL (ref 0.1–0.9)
Monocytes: 11 %
Neutrophils Absolute: 2 10*3/uL (ref 1.4–7.0)
Neutrophils: 51 %
Platelets: 182 10*3/uL (ref 150–450)
RBC: 4.75 x10E6/uL (ref 3.77–5.28)
RDW: 11.3 % — ABNORMAL LOW (ref 11.7–15.4)
WBC: 3.9 10*3/uL (ref 3.4–10.8)

## 2022-05-16 LAB — COMPREHENSIVE METABOLIC PANEL
ALT: 15 IU/L (ref 0–32)
AST: 24 IU/L (ref 0–40)
Albumin/Globulin Ratio: 2.3 — ABNORMAL HIGH (ref 1.2–2.2)
Albumin: 4.4 g/dL (ref 3.9–4.9)
Alkaline Phosphatase: 67 IU/L (ref 44–121)
BUN/Creatinine Ratio: 14 (ref 12–28)
BUN: 13 mg/dL (ref 8–27)
Bilirubin Total: 0.8 mg/dL (ref 0.0–1.2)
CO2: 24 mmol/L (ref 20–29)
Calcium: 9.6 mg/dL (ref 8.7–10.3)
Chloride: 104 mmol/L (ref 96–106)
Creatinine, Ser: 0.92 mg/dL (ref 0.57–1.00)
Globulin, Total: 1.9 g/dL (ref 1.5–4.5)
Glucose: 73 mg/dL (ref 70–99)
Potassium: 5.3 mmol/L — ABNORMAL HIGH (ref 3.5–5.2)
Sodium: 141 mmol/L (ref 134–144)
Total Protein: 6.3 g/dL (ref 6.0–8.5)
eGFR: 70 mL/min/{1.73_m2} (ref >59)

## 2022-05-16 LAB — TSH: TSH: 4.07 u[IU]/mL (ref 0.450–4.500)

## 2022-05-16 LAB — VITAMIN D 25 HYDROXY (VIT D DEFICIENCY, FRACTURES): Vit D, 25-Hydroxy: 13 ng/mL — ABNORMAL LOW (ref 30.0–100.0)

## 2022-05-21 ENCOUNTER — Ambulatory Visit
Admission: RE | Admit: 2022-05-21 | Discharge: 2022-05-21 | Disposition: A | Discharge status: Home / Self Care | Payer: BC Managed Care – PPO | Source: Ambulatory Visit | Attending: Internal Medicine | Admitting: Internal Medicine

## 2022-05-21 DIAGNOSIS — Z1231 Encounter for screening mammogram for malignant neoplasm of breast: Secondary | ICD-10-CM | POA: Diagnosis present

## 2022-11-21 IMAGING — MG MM DIGITAL SCREENING BILAT W/ TOMO AND CAD
8 series · 9 of 24 positions shown · non-contrast
Comparison: Previous exam(s).

CLINICAL DATA: Screening.

EXAM:
DIGITAL SCREENING BILATERAL MAMMOGRAM WITH TOMOSYNTHESIS AND CAD
TECHNIQUE: Bilateral screening digital craniocaudal and mediolateral oblique
mammograms were obtained. Bilateral screening digital breast
tomosynthesis was performed. The images were evaluated with
computer-aided detection.

[L CC synth-2D]
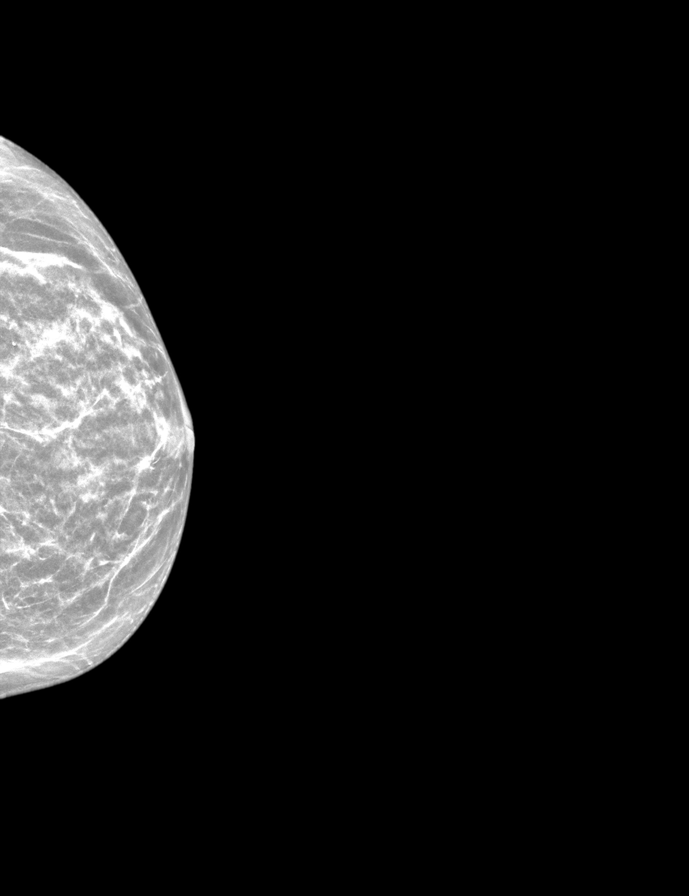

[R MLO synth-2D]
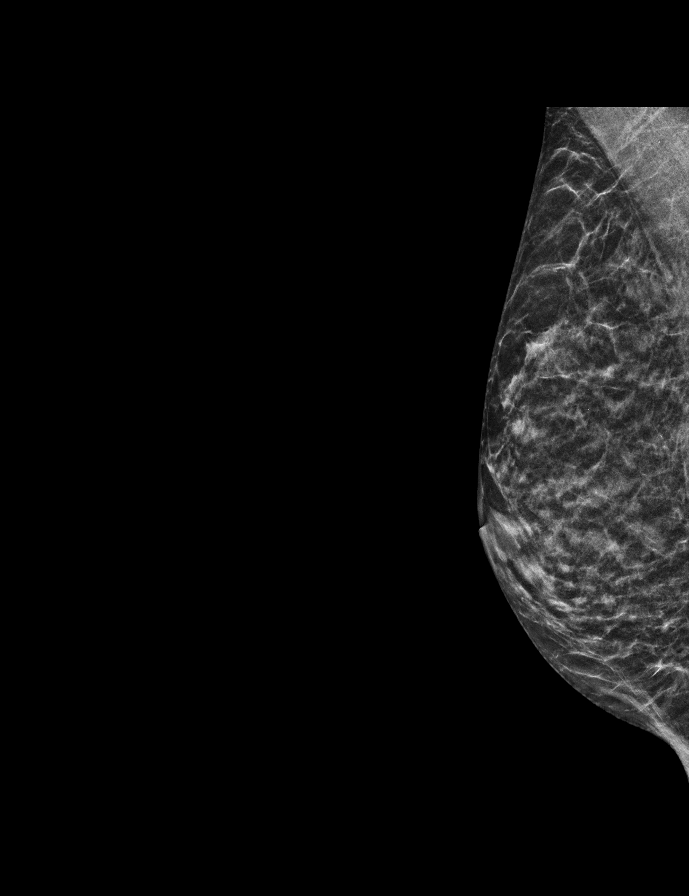

[R CC synth-2D]
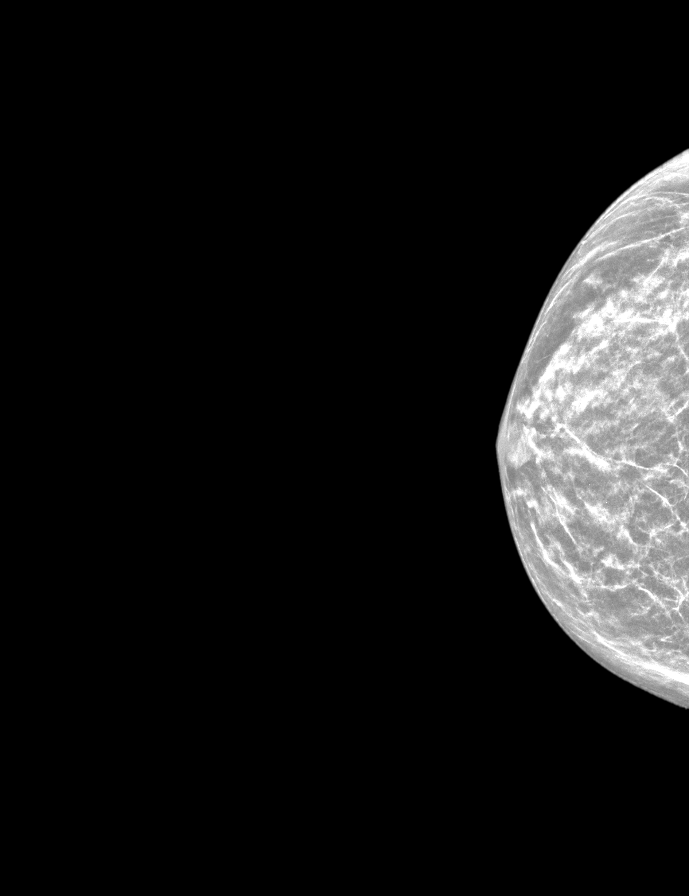

[L MLO synth-2D]
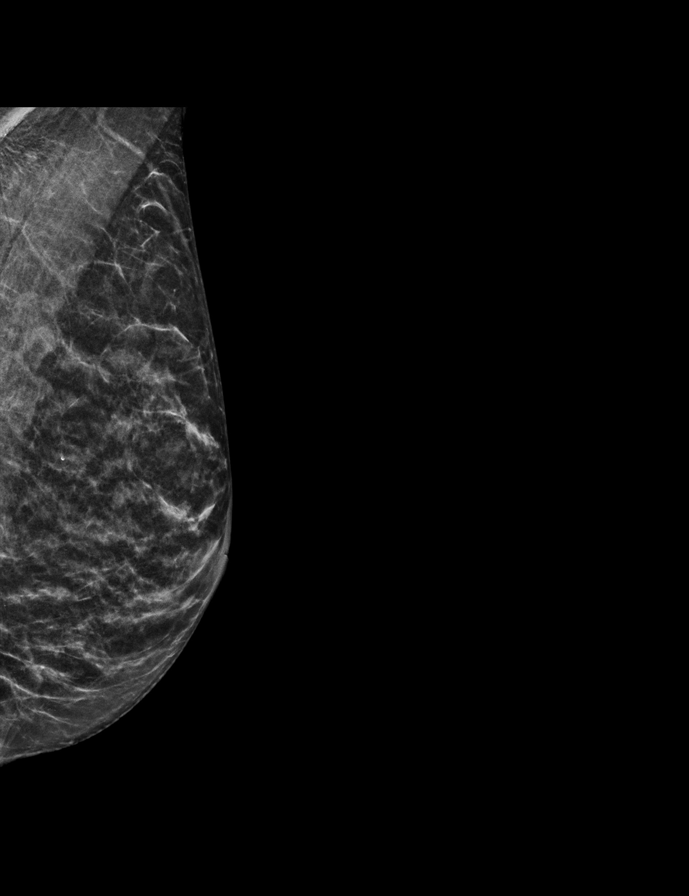

[R MLO tomo · 2 of 36 frames shown]
[frame 12/36]
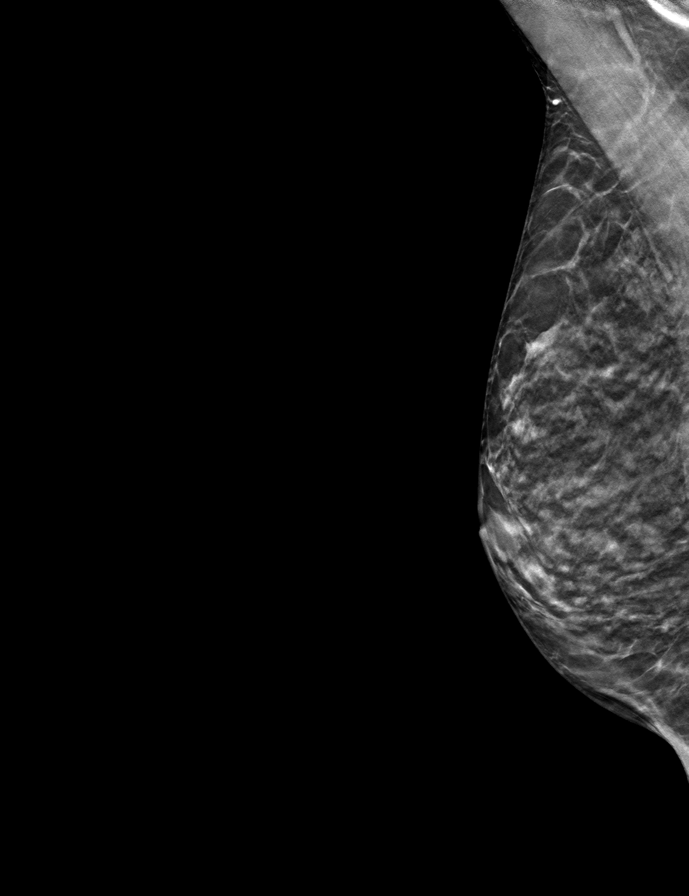
[frame 19/36]
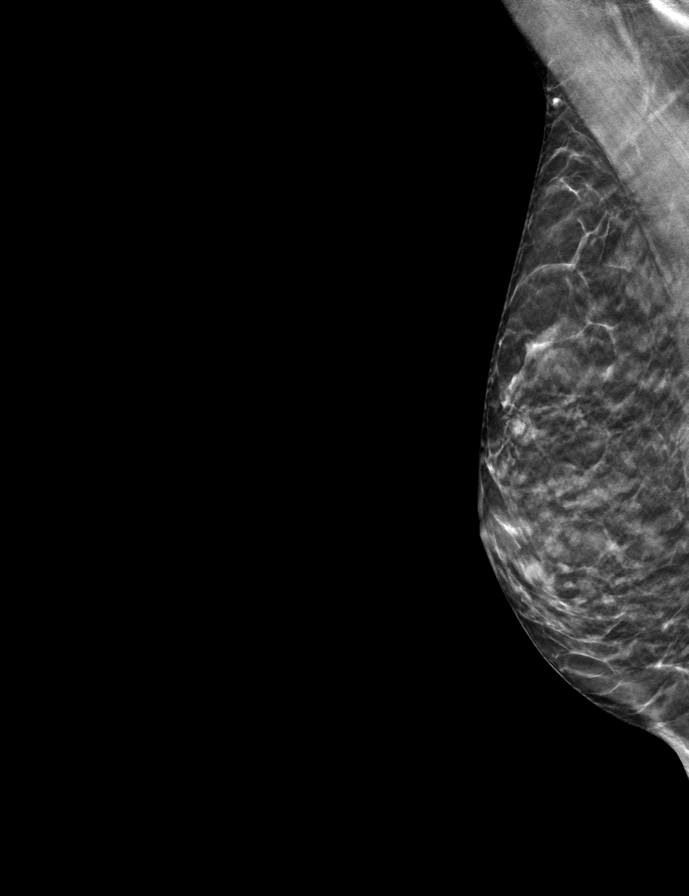

[L MLO tomo · tomo slice 20/39.0]
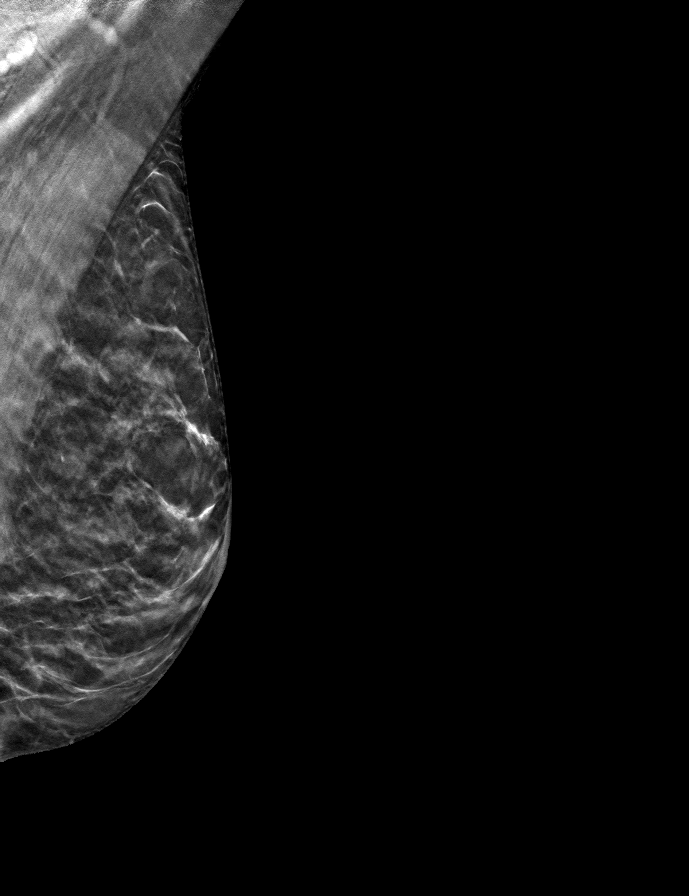

[R CC tomo · tomo slice 21/40.0]
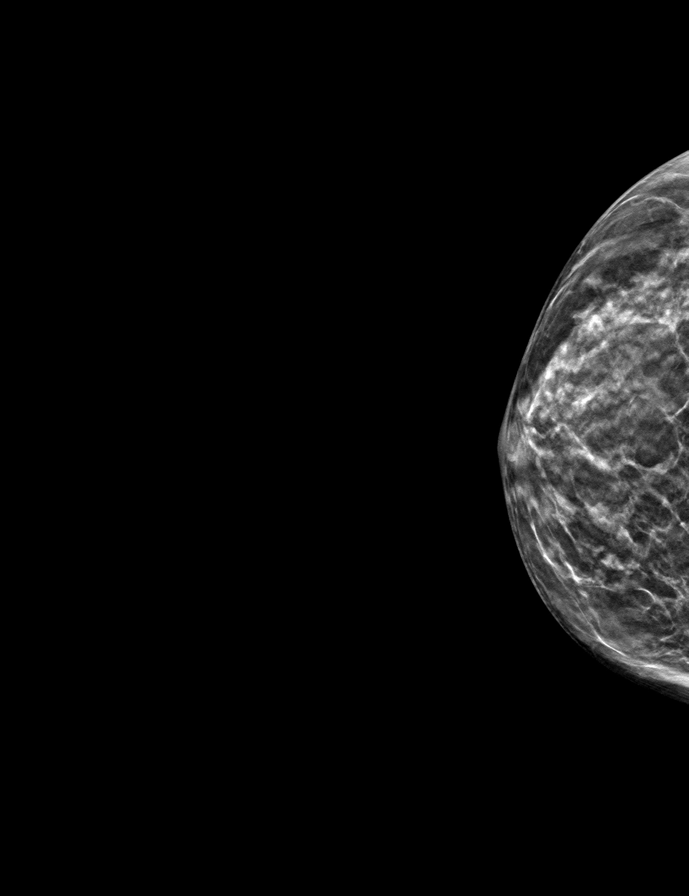

[L CC tomo · tomo slice 20/39.0]
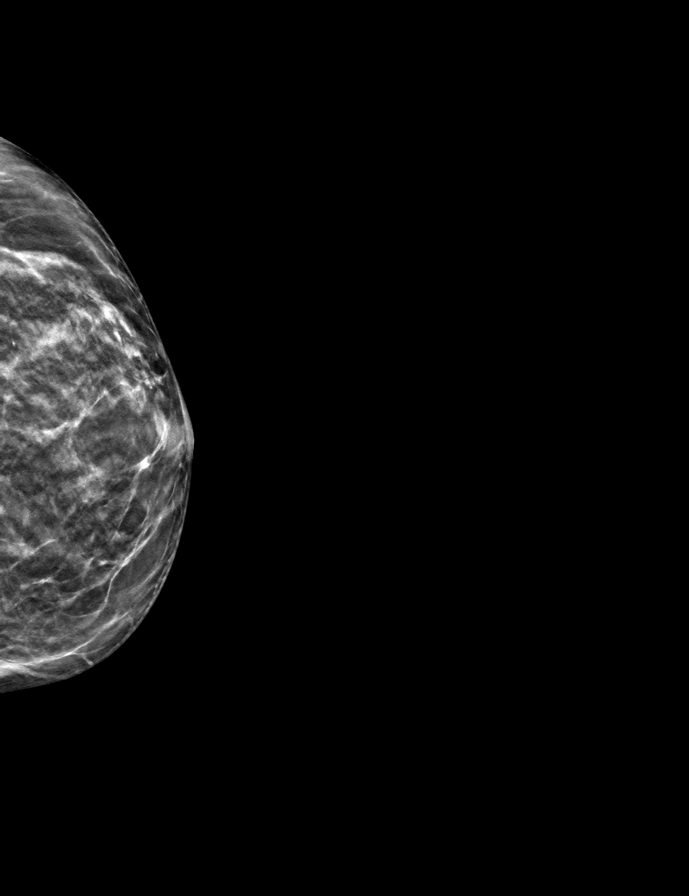

[9 of 24 positions shown; findings below may reference images not displayed]

ACR Breast Density Category c: The breast tissue is heterogeneously
dense, which may obscure small masses.
FINDINGS: There are no findings suspicious for malignancy.
IMPRESSION: No mammographic evidence of malignancy. A result letter of this
screening mammogram will be mailed directly to the patient.

RECOMMENDATION:
Screening mammogram in one year. (Code:Q3-W-BC3)

BI-RADS CATEGORY  1: Negative.

## 2023-02-11 ENCOUNTER — Ambulatory Visit: Payer: BC Managed Care – PPO | Admitting: Podiatry

## 2023-02-27 ENCOUNTER — Ambulatory Visit: Payer: BC Managed Care – PPO | Admitting: Podiatry

## 2023-03-25 ENCOUNTER — Encounter: Payer: Self-pay | Admitting: Podiatry

## 2023-03-25 ENCOUNTER — Ambulatory Visit (INDEPENDENT_AMBULATORY_CARE_PROVIDER_SITE_OTHER): Payer: BC Managed Care – PPO | Admitting: Podiatry

## 2023-03-25 DIAGNOSIS — D2372 Other benign neoplasm of skin of left lower limb, including hip: Secondary | ICD-10-CM | POA: Diagnosis not present

## 2023-03-25 DIAGNOSIS — D2371 Other benign neoplasm of skin of right lower limb, including hip: Secondary | ICD-10-CM | POA: Diagnosis not present

## 2023-03-25 NOTE — Progress Notes (Signed)
 She presents to the [place] office today after having not seen her for the past 2 and half years.  She states that the last time we trimmed those out it worked really well.  She is referring to the painful lesions to the plantar aspect of the bilateral foot.  Objective: Vital signs are stable she is alert and oriented x 3.  Pulses are palpable.  She has solitary porokeratotic lesions plantar aspect of the second metatarsal linearly and 1 solitary lesion subfifth metatarsal neck left foot.  There is no erythema edema cellulitis drainage or odor these are only tender with deep palpation and medial-lateral compression.  Assessment: Benign skin lesions plantar aspect bilateral foot.  Plan: Manual debridement of benign skin lesions today with chemical destruction of the remaining lesion Salinocaine under occlusion to be left in 3 days and washed off thoroughly.  She understands this and recalls it from last time.  I will follow-up with her on an as-needed basis.

## 2023-04-16 ENCOUNTER — Other Ambulatory Visit: Payer: Self-pay | Admitting: Internal Medicine

## 2023-04-16 DIAGNOSIS — Z1231 Encounter for screening mammogram for malignant neoplasm of breast: Secondary | ICD-10-CM

## 2023-05-17 ENCOUNTER — Encounter: Payer: Self-pay | Admitting: Internal Medicine

## 2023-05-22 ENCOUNTER — Other Ambulatory Visit (HOSPITAL_COMMUNITY)
Admission: RE | Admit: 2023-05-22 | Discharge: 2023-05-22 | Disposition: A | Discharge status: Home / Self Care | Source: Ambulatory Visit | Attending: Internal Medicine | Admitting: Internal Medicine

## 2023-05-22 ENCOUNTER — Ambulatory Visit (INDEPENDENT_AMBULATORY_CARE_PROVIDER_SITE_OTHER): Admitting: Internal Medicine

## 2023-05-22 ENCOUNTER — Encounter: Payer: Self-pay | Admitting: Internal Medicine

## 2023-05-22 VITALS — BP 116/74 | HR 64 | Ht 64.0 in | Wt 102.0 lb

## 2023-05-22 DIAGNOSIS — Z1231 Encounter for screening mammogram for malignant neoplasm of breast: Secondary | ICD-10-CM

## 2023-05-22 DIAGNOSIS — E785 Hyperlipidemia, unspecified: Secondary | ICD-10-CM

## 2023-05-22 DIAGNOSIS — Z0000 Encounter for general adult medical examination without abnormal findings: Secondary | ICD-10-CM | POA: Insufficient documentation

## 2023-05-22 DIAGNOSIS — E875 Hyperkalemia: Secondary | ICD-10-CM

## 2023-05-22 DIAGNOSIS — Z124 Encounter for screening for malignant neoplasm of cervix: Secondary | ICD-10-CM | POA: Insufficient documentation

## 2023-05-22 DIAGNOSIS — E559 Vitamin D deficiency, unspecified: Secondary | ICD-10-CM

## 2023-05-22 NOTE — Progress Notes (Signed)
 Date:  05/22/2023   Name:  Andrea Schultz   DOB:  1960-11-20   MRN:  [id number]   Chief Complaint: Annual Exam Andrea Schultz is a 63 y.o. female who presents today for her Complete Annual Exam. She feels well. She reports exercising at [employer], cycling, and yoga 3 - 4 days a week. She reports she is sleeping well. Breast complaints none.  Health Maintenance  Topic Date Due   COVID-19 Vaccine (3 - Moderna risk series) 01/22/2020   Flu Shot  09/13/2023   Pap with HPV screening  12/01/2023   Mammogram  05/20/2024   DTaP/Tdap/Td vaccine (3 - Td or Tdap) 02/09/2027   Colon Cancer Screening  07/15/2031   Hepatitis C Screening  Completed   HIV Screening  Completed   Zoster (Shingles) Vaccine  Completed   HPV Vaccine  Aged Out     HPI  Review of Systems  Constitutional:  Negative for chills, fatigue and fever.  HENT:  Negative for congestion, hearing loss, tinnitus, trouble swallowing and voice change.   Eyes:  Negative for visual disturbance.  Respiratory:  Negative for cough, chest tightness, shortness of breath and wheezing.   Cardiovascular:  Negative for chest pain, palpitations and leg swelling.  Gastrointestinal:  Negative for abdominal pain, constipation, diarrhea and vomiting.  Endocrine: Negative for polydipsia and polyuria.  Genitourinary:  Negative for dysuria, frequency, genital sores, vaginal bleeding and vaginal discharge.  Musculoskeletal:  Negative for arthralgias, gait problem and joint swelling.  Skin:  Negative for color change and rash.  Neurological:  Negative for dizziness, tremors, light-headedness and headaches.  Hematological:  Negative for adenopathy. Does not bruise/bleed easily.  Psychiatric/Behavioral:  Negative for dysphoric mood and sleep disturbance. The patient is not nervous/anxious.      Lab Results  Component Value Date   NA 141 05/15/2022   K 5.3 (H) 05/15/2022   CO2 24 05/15/2022   GLUCOSE 73 05/15/2022   BUN 13 05/15/2022    CREATININE 0.92 05/15/2022   CALCIUM 9.6 05/15/2022   EGFR 70 05/15/2022   GFRNONAA 69 03/30/2020   Lab Results  Component Value Date   CHOL 234 (H) 05/15/2022   HDL 114 05/15/2022   LDLCALC 106 (H) 05/15/2022   TRIG 84 05/15/2022   CHOLHDL 2.1 05/15/2022   Lab Results  Component Value Date   TSH 4.070 05/15/2022   Lab Results  Component Value Date   HGBA1C 5.4 05/11/2021   Lab Results  Component Value Date   WBC 3.9 05/15/2022   HGB 14.0 05/15/2022   HCT 40.7 05/15/2022   MCV 86 05/15/2022   PLT 182 05/15/2022   Lab Results  Component Value Date   ALT 15 05/15/2022   AST 24 05/15/2022   ALKPHOS 67 05/15/2022   BILITOT 0.8 05/15/2022   Lab Results  Component Value Date   VD25OH 13.0 (L) 05/15/2022     Patient Active Problem List   Diagnosis Date Noted   Recurrent urticaria 12/14/2020   Basal cell carcinoma 10/05/2020   Skin cancer (melanoma) (HCC) 10/05/2020   Vitamin D deficiency 10/05/2020   Osteopenia 11/08/2016    Allergies  Allergen Reactions   Sulfa Antibiotics Hives, Itching and Swelling    Past Surgical History:  Procedure Laterality Date   COLONOSCOPY WITH PROPOFOL N/A 07/14/2021   Procedure: COLONOSCOPY WITH PROPOFOL;  Surgeon: Wyline Mood, MD;  Location: [facility] ENDOSCOPY;  Service: Gastroenterology;  Laterality: N/A;   TONSILECTOMY/ADENOIDECTOMY WITH MYRINGOTOMY     TONSILLECTOMY  Social History   Tobacco Use   Smoking status: Former    Current packs/day: 0.00    Average packs/day: 0.3 packs/day for 0.5 years (0.1 ttl pk-yrs)    Types: Cigarettes    Start date: 02/13/1983    Quit date: 07/14/1983    Years since quitting: 39.8   Smokeless tobacco: Never  Vaping Use   Vaping status: Never Used  Substance Use Topics   Alcohol use: Yes    Alcohol/week: 10.0 standard drinks of alcohol    Types: 10 Standard drinks or equivalent per week   Drug use: Never     Medication list has been reviewed and updated.  Current Meds  Medication  Sig   Fexofenadine HCl (ALLEGRA PO) Take by mouth in the morning and at bedtime.   guaiFENesin (MUCINEX PO) Take by mouth in the morning and at bedtime.       05/22/2023    8:08 AM 05/15/2022    8:00 AM 05/11/2021    9:18 AM 12/14/2020    3:13 PM  GAD 7 : Generalized Anxiety Score  Nervous, Anxious, on Edge 0 0 0 0  Control/stop worrying 0 0 0 0  Worry too much - different things 0 0 0 0  Trouble relaxing 0 0 0 0  Restless 0 0 0 0  Easily annoyed or irritable 0 0 0 0  Afraid - awful might happen 0 0 0 0  Total GAD 7 Score 0 0 0 0  Anxiety Difficulty Not difficult at all Not difficult at all  Not difficult at all       05/22/2023    8:08 AM 05/15/2022    7:59 AM 05/11/2021    9:18 AM  Depression screen PHQ 2/9  Decreased Interest 0 0 0  Down, Depressed, Hopeless 0 0 0  PHQ - 2 Score 0 0 0  Altered sleeping 0 0 0  Tired, decreased energy 0 2 0  Change in appetite 0 0 0  Feeling bad or failure about yourself  0 0 0  Trouble concentrating 0 1 0  Moving slowly or fidgety/restless 0 0 0  Suicidal thoughts 0 0 0  PHQ-9 Score 0 3 0  Difficult doing work/chores Not difficult at all Not difficult at all Not difficult at all    BP Readings from Last 3 Encounters:  05/22/23 116/74  05/15/22 110/70  07/14/21 (!) 95/51    Physical Exam Vitals and nursing note reviewed.  Constitutional:      General: She is not in acute distress.    Appearance: She is well-developed.  HENT:     Head: Normocephalic and atraumatic.     Right Ear: Tympanic membrane and ear canal normal.     Left Ear: Tympanic membrane and ear canal normal.     Nose:     Right Sinus: No maxillary sinus tenderness.     Left Sinus: No maxillary sinus tenderness.  Eyes:     General: No scleral icterus.       Right eye: No discharge.        Left eye: No discharge.     Conjunctiva/sclera: Conjunctivae normal.  Neck:     Thyroid: No thyromegaly.     Vascular: No carotid bruit.  Cardiovascular:     Rate and Rhythm:  Normal rate and regular rhythm.     Pulses: Normal pulses.     Heart sounds: Normal heart sounds.  Pulmonary:     Effort: Pulmonary effort is normal. No  respiratory distress.     Breath sounds: No wheezing.  Abdominal:     General: Bowel sounds are normal.     Palpations: Abdomen is soft.     Tenderness: There is no abdominal tenderness.  Genitourinary:    Labia:        Right: No tenderness, lesion or injury.        Left: No tenderness, lesion or injury.      Vagina: Normal.     Cervix: Normal.     Uterus: Normal.      Adnexa: Right adnexa normal and left adnexa normal.     Comments: Pap obtained Musculoskeletal:        General: Normal range of motion.     Cervical back: Normal range of motion. No erythema.     Right lower leg: No edema.     Left lower leg: No edema.  Lymphadenopathy:     Cervical: No cervical adenopathy.  Skin:    General: Skin is warm and dry.     Capillary Refill: Capillary refill takes less than 2 seconds.     Findings: No rash.  Neurological:     General: No focal deficit present.     Mental Status: She is alert and oriented to person, place, and time.     Cranial Nerves: No cranial nerve deficit.     Sensory: No sensory deficit.     Deep Tendon Reflexes: Reflexes are normal and symmetric.  Psychiatric:        Attention and Perception: Attention normal.        Mood and Affect: Mood normal.        Behavior: Behavior normal.     Wt Readings from Last 3 Encounters:  05/22/23 102 lb (46.3 kg)  05/15/22 103 lb (46.7 kg)  07/14/21 95 lb 3.1 oz (43.2 kg)    BP 116/74   Pulse 64   Ht 5\' 4"  (1.626 m)   Wt 102 lb (46.3 kg)   SpO2 100%   BMI 17.51 kg/m   Assessment and Plan:  Problem List Items Addressed This Visit       Unprioritized   Vitamin D deficiency (Chronic)   Begin vitamin D supplement 2000 international units daily.       Relevant Orders   VITAMIN D 25 Hydroxy (Vit-D Deficiency, Fractures)   Other Visit Diagnoses        Annual physical exam    -  Primary   up to date on immunizations rec DEXA and Prevnar 20 after age 40   Relevant Orders   CBC with Differential/Platelet   Comprehensive metabolic panel with GFR   Lipid panel   Cytology - PAP   TSH     Encounter for screening mammogram for breast cancer       scheduled for next week     Encounter for screening for cervical cancer       Relevant Orders   Cytology - PAP     Mild hyperlipidemia       Relevant Orders   Lipid panel     Hyperkalemia       Relevant Orders   Comprehensive metabolic panel with GFR       Return in about 1 year (around 05/21/2024) for CPX w Dr. Elaina Pattee?Reubin Milan, MD [facility] [place]

## 2023-05-22 NOTE — Assessment & Plan Note (Signed)
 Begin vitamin D supplement 2000 international units daily.

## 2023-05-22 NOTE — Patient Instructions (Signed)
Vitamin D 2000 daily

## 2023-05-24 ENCOUNTER — Encounter: Payer: Self-pay | Admitting: Internal Medicine

## 2023-05-24 LAB — CYTOLOGY - PAP
Comment: NEGATIVE
Diagnosis: NEGATIVE
High risk HPV: NEGATIVE

## 2023-05-25 LAB — COMPREHENSIVE METABOLIC PANEL WITH GFR
ALT: 22 IU/L (ref 0–32)
AST: 25 IU/L (ref 0–40)
Albumin: 4.2 g/dL (ref 3.9–4.9)
Alkaline Phosphatase: 66 IU/L (ref 44–121)
BUN/Creatinine Ratio: 18 (ref 12–28)
BUN: 15 mg/dL (ref 8–27)
Bilirubin Total: 0.6 mg/dL (ref 0.0–1.2)
CO2: 24 mmol/L (ref 20–29)
Calcium: 9.3 mg/dL (ref 8.7–10.3)
Chloride: 103 mmol/L (ref 96–106)
Creatinine, Ser: 0.85 mg/dL (ref 0.57–1.00)
Globulin, Total: 1.9 g/dL (ref 1.5–4.5)
Glucose: 80 mg/dL (ref 70–99)
Potassium: 5 mmol/L (ref 3.5–5.2)
Sodium: 140 mmol/L (ref 134–144)
Total Protein: 6.1 g/dL (ref 6.0–8.5)
eGFR: 76 mL/min/{1.73_m2} (ref >59)

## 2023-05-25 LAB — CBC WITH DIFFERENTIAL/PLATELET
Basophils Absolute: 0 10*3/uL (ref 0.0–0.2)
Basos: 1 %
EOS (ABSOLUTE): 0.1 10*3/uL (ref 0.0–0.4)
Eos: 3 %
Hematocrit: 39.9 % (ref 34.0–46.6)
Hemoglobin: 13.6 g/dL (ref 11.1–15.9)
Immature Grans (Abs): 0 10*3/uL (ref 0.0–0.1)
Immature Granulocytes: 0 %
Lymphocytes Absolute: 1 10*3/uL (ref 0.7–3.1)
Lymphs: 31 %
MCH: 29.9 pg (ref 26.6–33.0)
MCHC: 34.1 g/dL (ref 31.5–35.7)
MCV: 88 fL (ref 79–97)
Monocytes Absolute: 0.3 10*3/uL (ref 0.1–0.9)
Monocytes: 10 %
Neutrophils Absolute: 1.8 10*3/uL (ref 1.4–7.0)
Neutrophils: 55 %
Platelets: 206 10*3/uL (ref 150–450)
RBC: 4.55 x10E6/uL (ref 3.77–5.28)
RDW: 11.8 % (ref 11.7–15.4)
WBC: 3.3 10*3/uL — ABNORMAL LOW (ref 3.4–10.8)

## 2023-05-25 LAB — VITAMIN D 25 HYDROXY (VIT D DEFICIENCY, FRACTURES): Vit D, 25-Hydroxy: 25.4 ng/mL — ABNORMAL LOW (ref 30.0–100.0)

## 2023-05-25 LAB — LIPID PANEL
Chol/HDL Ratio: 2 ratio (ref 0.0–4.4)
Cholesterol, Total: 206 mg/dL — ABNORMAL HIGH (ref 100–199)
HDL: 104 mg/dL (ref >39)
LDL Chol Calc (NIH): 93 mg/dL (ref 0–99)
Triglycerides: 49 mg/dL (ref 0–149)
VLDL Cholesterol Cal: 9 mg/dL (ref 5–40)

## 2023-05-25 LAB — TSH: TSH: 3.86 u[IU]/mL (ref 0.450–4.500)

## 2023-05-27 ENCOUNTER — Ambulatory Visit

## 2023-05-29 ENCOUNTER — Ambulatory Visit
Admission: RE | Admit: 2023-05-29 | Discharge: 2023-05-29 | Disposition: A | Discharge status: Home / Self Care | Source: Ambulatory Visit | Attending: Internal Medicine | Admitting: Internal Medicine

## 2023-05-29 DIAGNOSIS — Z1231 Encounter for screening mammogram for malignant neoplasm of breast: Secondary | ICD-10-CM | POA: Diagnosis present

## 2023-07-29 ENCOUNTER — Ambulatory Visit (INDEPENDENT_AMBULATORY_CARE_PROVIDER_SITE_OTHER): Admitting: Internal Medicine

## 2023-07-29 ENCOUNTER — Encounter: Payer: Self-pay | Admitting: Internal Medicine

## 2023-07-29 VITALS — BP 128/72 | HR 81 | Ht 64.0 in | Wt 102.2 lb

## 2023-07-29 DIAGNOSIS — L282 Other prurigo: Secondary | ICD-10-CM | POA: Diagnosis not present

## 2023-07-29 MED ORDER — PREDNISONE 10 MG PO TABS: ORAL_TABLET | ORAL | 0 refills | Status: AC

## 2023-07-29 NOTE — Progress Notes (Signed)
 Date:  07/29/2023   Name:  Andrea Schultz   DOB:  Apr 14, 1960   MRN:  [id number]   Chief Complaint: Rash (Bilateral arms. Started on Rt arm. Started x 2 weeks ago. Itching, and slightly sore. Taking OTC Allegra twice a day. Tried OTC anti-itch cream and it burns the rash, and makes it itch even more. )  Rash This is a new problem. The current episode started 1 to 4 weeks ago. The problem has been gradually worsening since onset. Pertinent negatives include no fatigue, fever or shortness of breath.  Started after a bike ride in [place] with 6 lesions on right forearm that were red, raised and itchy.  She did scratch the area and it is now sore.  More lesions have come up in the same area and a few on the lower left arm. She is using Aveeno moisturizing cream.  Anti-itch cream burns.  She has no other symptoms.  Review of Systems  Constitutional:  Negative for chills, fatigue and fever.  HENT:  Negative for trouble swallowing.   Respiratory:  Negative for chest tightness and shortness of breath.   Cardiovascular:  Negative for chest pain.  Skin:  Positive for rash.  Psychiatric/Behavioral:  Negative for dysphoric mood and sleep disturbance. The patient is not nervous/anxious.      Lab Results  Component Value Date   NA 140 05/22/2023   K 5.0 05/22/2023   CO2 24 05/22/2023   GLUCOSE 80 05/22/2023   BUN 15 05/22/2023   CREATININE 0.85 05/22/2023   CALCIUM 9.3 05/22/2023   EGFR 76 05/22/2023   GFRNONAA 69 03/30/2020   Lab Results  Component Value Date   CHOL 206 (H) 05/22/2023   HDL 104 05/22/2023   LDLCALC 93 05/22/2023   TRIG 49 05/22/2023   CHOLHDL 2.0 05/22/2023   Lab Results  Component Value Date   TSH 3.860 05/22/2023   Lab Results  Component Value Date   HGBA1C 5.4 05/11/2021   Lab Results  Component Value Date   WBC 3.3 (L) 05/22/2023   HGB 13.6 05/22/2023   HCT 39.9 05/22/2023   MCV 88 05/22/2023   PLT 206 05/22/2023   Lab Results  Component Value  Date   ALT 22 05/22/2023   AST 25 05/22/2023   ALKPHOS 66 05/22/2023   BILITOT 0.6 05/22/2023   Lab Results  Component Value Date   VD25OH 25.4 (L) 05/22/2023     Patient Active Problem List   Diagnosis Date Noted   Recurrent urticaria 12/14/2020   Basal cell carcinoma 10/05/2020   Skin cancer (melanoma) (HCC) 10/05/2020   Vitamin D  deficiency 10/05/2020   Osteopenia 11/08/2016    Allergies  Allergen Reactions   Sulfa Antibiotics Hives, Itching and Swelling    Past Surgical History:  Procedure Laterality Date   COLONOSCOPY WITH PROPOFOL  N/A 07/14/2021   Procedure: COLONOSCOPY WITH PROPOFOL ;  Surgeon: Luke Salaam, MD;  Location: [facility] END[facility]COPY;  Service: Gastroenterology;  Laterality: N/A;   TONSILECTOMY/ADENOIDECTOMY WITH MYRINGOTOMY     TONSILLECTOMY      Social History   Tobacco Use   Smoking status: Former    Current packs/day: 0.00    Average packs/day: 0.3 packs/day for 0.5 years (0.1 ttl pk-yrs)    Types: Cigarettes    Start date: 02/13/1983    Quit date: 07/14/1983    Years since quitting: 40.0   Smokeless tobacco: Never  Vaping Use   Vaping status: Never Used  Substance Use Topics   Alcohol  use: Yes    Alcohol/week: 10.0 standard drinks of alcohol    Types: 10 Standard drinks or equivalent per week   Drug use: Never     Medication list has been reviewed and updated.  Current Meds  Medication Sig   predniSONE (DELTASONE) 10 MG tablet Take 6 tablets (60 mg total) by mouth daily with breakfast for 1 day, THEN 5 tablets (50 mg total) daily with breakfast for 1 day, THEN 4 tablets (40 mg total) daily with breakfast for 1 day, THEN 3 tablets (30 mg total) daily with breakfast for 1 day, THEN 2 tablets (20 mg total) daily with breakfast for 1 day, THEN 1 tablet (10 mg total) daily with breakfast for 1 day.       05/22/2023    8:08 AM 05/15/2022    8:00 AM 05/11/2021    9:18 AM 12/14/2020    3:13 PM  GAD 7 : Generalized Anxiety Score  Nervous, Anxious, on Edge  0 0 0 0  Control/stop worrying 0 0 0 0  Worry too much - different things 0 0 0 0  Trouble relaxing 0 0 0 0  Restless 0 0 0 0  Easily annoyed or irritable 0 0 0 0  Afraid - awful might happen 0 0 0 0  Total GAD 7 Score 0 0 0 0  Anxiety Difficulty Not difficult at all Not difficult at all  Not difficult at all       05/22/2023    8:08 AM 05/15/2022    7:59 AM 05/11/2021    9:18 AM  Depression screen PHQ 2/9  Decreased Interest 0 0 0  Down, Depressed, Hopeless 0 0 0  PHQ - 2 Score 0 0 0  Altered sleeping 0 0 0  Tired, decreased energy 0 2 0  Change in appetite 0 0 0  Feeling bad or failure about yourself  0 0 0  Trouble concentrating 0 1 0  Moving slowly or fidgety/restless 0 0 0  Suicidal thoughts 0 0 0  PHQ-9 Score 0 3 0  Difficult doing work/chores Not difficult at all Not difficult at all Not difficult at all    BP Readings from Last 3 Encounters:  07/29/23 128/72  05/22/23 116/74  05/15/22 110/70    Physical Exam Vitals and nursing note reviewed.  Constitutional:      General: She is not in acute distress.    Appearance: Normal appearance. She is well-developed.  HENT:     Head: Normocephalic and atraumatic.   Cardiovascular:     Rate and Rhythm: Normal rate and regular rhythm.  Pulmonary:     Effort: Pulmonary effort is normal. No respiratory distress.     Breath sounds: No wheezing or rhonchi.   Skin:    General: Skin is warm and dry.     Findings: No rash.         Comments: Pinpoint red macules with evidence of excoriation; no surrounding erythema, drainage, odor   Neurological:     Mental Status: She is alert and oriented to person, place, and time.   Psychiatric:        Mood and Affect: Mood normal.        Behavior: Behavior normal.     Wt Readings from Last 3 Encounters:  07/29/23 102 lb 3.2 oz (46.4 kg)  05/22/23 102 lb (46.3 kg)  05/15/22 103 lb (46.7 kg)    BP 128/72   Pulse 81   Ht 5' 4 (1.626 m)  Wt 102 lb 3.2 oz (46.4 kg)   SpO2  98%   BMI 17.54 kg/m   Assessment and Plan:  Problem List Items Addressed This Visit   None Visit Diagnoses       Pruritic rash    -  Primary   Likely due to unknown insect bites and ongoing pruritis continue topical moisturizers Steroid taper   Relevant Medications   predniSONE (DELTASONE) 10 MG tablet       No follow-ups on file.    Sheron Dixons, MD [facility] and [facility] Medicine [place]

## 2023-09-12 ENCOUNTER — Encounter: Payer: Self-pay | Admitting: Family Medicine

## 2023-09-12 ENCOUNTER — Ambulatory Visit (INDEPENDENT_AMBULATORY_CARE_PROVIDER_SITE_OTHER): Admitting: Family Medicine

## 2023-09-12 VITALS — BP 104/58 | HR 65 | Ht 64.0 in | Wt 103.2 lb

## 2023-09-12 DIAGNOSIS — M62830 Muscle spasm of back: Secondary | ICD-10-CM | POA: Insufficient documentation

## 2023-09-12 DIAGNOSIS — S76812A Strain of other specified muscles, fascia and tendons at thigh level, left thigh, initial encounter: Secondary | ICD-10-CM | POA: Diagnosis not present

## 2023-09-12 MED ORDER — MELOXICAM 7.5 MG PO TABS: 7.5000 mg | ORAL_TABLET | Freq: Every day | ORAL | 0 refills | Status: DC

## 2023-09-12 MED ORDER — METHOCARBAMOL 500 MG PO TABS: 500.0000 mg | ORAL_TABLET | Freq: Three times a day (TID) | ORAL | 0 refills | Status: AC | PRN

## 2023-09-12 NOTE — Assessment & Plan Note (Signed)
 Patient presents with atraumatic spinal crepitus, progressive over the past 6 months, not associated with pain.  She localizes symptoms to the left greater than right hips, denies any radiation throughout the extremities, no paresthesias, numbness or tingling.  She denies any weakness, reports no bowel/bladder involvement or saddle paresthesia, denies any change in activity preceding onset of symptoms.  She is highly active at baseline with workouts throughout the week.  Physical Exam  Lumbar Spine and Bilateral Hip Exam  INSPECTION: Hemi-elevation of the right shoulder noted on forward flexion, otherwise no visible spinal or pelvic abnormalities  PALPATION: Not specifically documented  RANGE OF MOTION (ROM) ASSESSMENT: Increased ROM on the right hip with contralateral symptom reproduction, otherwise within functional limits  NEUROMUSCULAR: Sensorimotor function grossly intact  SPECIAL TESTS: - Straight leg raise: Asymmetric hamstring tightness on the left without paresthesias, Right side benign - FADIR: Positive on the left, localizing to the groin with associated subluxation and crepitus - FABER: Benign bilaterally - Piriformis test: Equivocal left - Iliopsoas test: Positive on the left - Provocative testing: Negative on the right apart from contralateral symptom reproduction with increased ROM  Left iliopsoas muscle strain in the setting of paraspinal lumbar strain High level of activity coupled with chronicity and examination findings today show focality to the left iliopsoas tendon across the hip with paraspinal lumbar involvement asymmetrically to the left. - Start meloxicam  7.5 once daily with food x 2 weeks, after 2 weeks take once daily on an as-needed basis - Can dose methocarbamol  (muscle relaxer) 500 mg up to 3 times a day on an as-needed basis for muscle tightness pain, side effect can be drowsiness - Hold from typical/current athletic routine x 3 weeks - Start home-based  rehab with information provided and sent via MyChart - Contact our office at the 3-week mark to provide a status update and to discuss next steps - Return for formal follow-up in 6 weeks - Contact for any questions between now and then

## 2023-09-12 NOTE — Progress Notes (Signed)
 Primary Care / Sports Medicine Office Visit  Patient Information:  Patient ID: Andrea Schultz, female DOB: [birth year] Age: 63 y.o. MRN: [id number]   Dustine Schultz is a pleasant 63 y.o. female presenting with the following:  Chief Complaint  Patient presents with   Back Pain    Low back popping and cracking x 6 months. Patient exercises 5 days a week and she has concerns that something is wrong. Tightness on left side of low back going into hip. Lower body exercises are a aggravating factor.     Vitals:   09/12/23 1314  BP: (!) 104/58  Pulse: 65  SpO2: 99%   Vitals:   09/12/23 1314  Weight: 103 lb 3.2 oz (46.8 kg)  Height: 5' 4 (1.626 m)   Body mass index is 17.71 kg/m.  No results found.   Independent interpretation of notes and tests performed by another provider:   None  Procedures performed:   None  Pertinent History, Exam, Impression, and Recommendations:   Problem List Items Addressed This Visit     Lumbar paraspinal muscle spasm - Primary   Relevant Medications   meloxicam  (MOBIC ) 7.5 MG tablet   methocarbamol  (ROBAXIN ) 500 MG tablet   Strain of left iliopsoas muscle   Patient presents with atraumatic spinal crepitus, progressive over the past 6 months, not associated with pain.  She localizes symptoms to the left greater than right hips, denies any radiation throughout the extremities, no paresthesias, numbness or tingling.  She denies any weakness, reports no bowel/bladder involvement or saddle paresthesia, denies any change in activity preceding onset of symptoms.  She is highly active at baseline with workouts throughout the week.  Physical Exam  Lumbar Spine and Bilateral Hip Exam  INSPECTION: Hemi-elevation of the right shoulder noted on forward flexion, otherwise no visible spinal or pelvic abnormalities  PALPATION: Not specifically documented  RANGE OF MOTION (ROM) ASSESSMENT: Increased ROM on the right hip with contralateral symptom  reproduction, otherwise within functional limits  NEUROMUSCULAR: Sensorimotor function grossly intact  SPECIAL TESTS: - Straight leg raise: Asymmetric hamstring tightness on the left without paresthesias, Right side benign - FADIR: Positive on the left, localizing to the groin with associated subluxation and crepitus - FABER: Benign bilaterally - Piriformis test: Equivocal left - Iliopsoas test: Positive on the left - Provocative testing: Negative on the right apart from contralateral symptom reproduction with increased ROM  Left iliopsoas muscle strain in the setting of paraspinal lumbar strain High level of activity coupled with chronicity and examination findings today show focality to the left iliopsoas tendon across the hip with paraspinal lumbar involvement asymmetrically to the left. - Start meloxicam  7.5 once daily with food x 2 weeks, after 2 weeks take once daily on an as-needed basis - Can dose methocarbamol  (muscle relaxer) 500 mg up to 3 times a day on an as-needed basis for muscle tightness pain, side effect can be drowsiness - Hold from typical/current athletic routine x 3 weeks - Start home-based rehab with information provided and sent via MyChart - Contact our office at the 3-week mark to provide a status update and to discuss next steps - Return for formal follow-up in 6 weeks - Contact for any questions between now and then      Relevant Medications   meloxicam  (MOBIC ) 7.5 MG tablet   methocarbamol  (ROBAXIN ) 500 MG tablet     Orders & Medications Medications:  Meds ordered this encounter  Medications   meloxicam  (MOBIC ) 7.5 MG tablet  Sig: Take 1 tablet (7.5 mg total) by mouth daily.    Dispense:  30 tablet    Refill:  0   methocarbamol  (ROBAXIN ) 500 MG tablet    Sig: Take 1 tablet (500 mg total) by mouth every 8 (eight) hours as needed for muscle spasms.    Dispense:  30 tablet    Refill:  0   No orders of the defined types were placed in this  encounter.    No follow-ups on file.     Andrea JINNY Ku, MD, [facility]   Primary Care Sports Medicine Primary Care and Sports Medicine at [facility] [place]

## 2023-09-12 NOTE — Patient Instructions (Signed)
-   Start meloxicam  7.5 once daily with food x 2 weeks, after 2 weeks take once daily on an as-needed basis - Can dose methocarbamol  (muscle relaxer) 500 mg up to 3 times a day on an as-needed basis for muscle tightness pain, side effect can be drowsiness - Hold from typical/current athletic routine x 3 weeks - Start home-based rehab with information provided and sent via MyChart - Contact our office at the 3-week mark to provide a status update and to discuss next steps - Return for formal follow-up in 6 weeks - Contact for any questions between now and then

## 2023-09-16 ENCOUNTER — Encounter: Payer: Self-pay | Admitting: Family Medicine

## 2023-09-16 NOTE — Telephone Encounter (Signed)
 Please review patient message and advise.   JM

## 2023-09-23 NOTE — Telephone Encounter (Signed)
 Please review and advise if patient should go to spin class. I have already addressed gym note.  JM

## 2023-09-25 ENCOUNTER — Other Ambulatory Visit: Payer: Self-pay | Admitting: Medical Genetics

## 2023-10-08 ENCOUNTER — Other Ambulatory Visit: Admission: RE | Admit: 2023-10-08 | Payer: Self-pay | Source: Ambulatory Visit

## 2023-10-08 ENCOUNTER — Other Ambulatory Visit
Admission: RE | Admit: 2023-10-08 | Discharge: 2023-10-08 | Disposition: A | Discharge status: Home / Self Care | Payer: Self-pay | Source: Ambulatory Visit | Attending: Medical Genetics | Admitting: Medical Genetics

## 2023-10-20 LAB — GENECONNECT MOLECULAR SCREEN: Genetic Analysis Overall Interpretation: NEGATIVE

## 2023-10-24 ENCOUNTER — Ambulatory Visit (INDEPENDENT_AMBULATORY_CARE_PROVIDER_SITE_OTHER): Admitting: Family Medicine

## 2023-10-24 ENCOUNTER — Encounter: Payer: Self-pay | Admitting: Family Medicine

## 2023-10-24 DIAGNOSIS — M62830 Muscle spasm of back: Secondary | ICD-10-CM | POA: Diagnosis not present

## 2023-10-24 DIAGNOSIS — S76812A Strain of other specified muscles, fascia and tendons at thigh level, left thigh, initial encounter: Secondary | ICD-10-CM

## 2023-10-24 MED ORDER — MELOXICAM 7.5 MG PO TABS: 7.5000 mg | ORAL_TABLET | Freq: Every day | ORAL | 0 refills | Status: AC | PRN

## 2023-10-30 NOTE — Assessment & Plan Note (Signed)
 History of Present Illness Andrea Schultz is a 63 year old female who presents with left-sided iliopsoas and paraspinal muscle tightness.  Left iliopsoas and paraspinal muscle tightness - Ongoing muscle tightness involving the left iliopsoas and paraspinal muscles - Sensation described as 'hugging and snugging' around the spine and crossing to the front of the hip - Previous treatment with anti-inflammatory medication and muscle relaxer provided some improvement - No significant pain or discomfort outside of the muscle issues  Back popping and associated muscle pain - Recent development of a popping sensation in the back during leg extensions - Initially painless, now associated with muscle pain  Physical activity and exercise modification - Attends core classes, which are beneficial - Plans to return to lower body camp class, modifying exercises to avoid heavy weights and using body weight exercises instead  Medication use for musculoskeletal symptoms - Current regimen includes anti-inflammatory drugs and muscle relaxers, used as needed - Nearly out of anti-inflammatory medication and plans to refill  Physical Exam PALPATION: Non-tender at the left greater trochanter and left tensor fascia lata. Mild tenderness at the left SI joint and mid-substance gluteus medius. Right SI joint and IT band are non-tender. RANGE OF MOTION: Right and left hamstrings exhibit bowstringing at 45 degrees of hip flexion. SPECIAL TESTS: Minor tenderness with FADIR testing on the left. Negative bilirucerol testing on the left.  Assessment and Plan Left iliopsoas and paraspinal muscle strain Improvement noted with previous medications. Persistent painless popping and uneven leg appearance indicate musculoskeletal imbalance. - Switch to as-needed dosing for anti-inflammatory and muscle relaxer medications. - Refer to physical therapy for tailored exercise program. - Advise continuation of physical therapy  exercises beyond formal sessions. - Recommend body weight exercises initially, caution against heavy weights.  Left gluteus medius and sacroiliac joint tenderness Mild tenderness suggests compensation for initial back issues. Improvement noted but not fully resolved. - Include gluteus medius and sacroiliac joint in physical therapy treatment plan. - Monitor for symptom flare-ups and adjust activity level accordingly.

## 2023-10-30 NOTE — Assessment & Plan Note (Signed)
 See additional assessment(s) for plan details.

## 2023-10-30 NOTE — Patient Instructions (Signed)
 Patient Plan  Left Iliopsoas and Paraspinal Muscle Strain  - Use anti-inflammatory and muscle relaxer medications only as needed. - Start physical therapy for a personalized exercise program. - Continue physical therapy exercises at home after formal sessions end. - Begin with body weight exercises; avoid heavy weights for now.  Left Gluteus Medius and Sacroiliac Joint Tenderness  - Ensure physical therapy includes treatment for the gluteus medius and sacroiliac joint. - Watch for any flare-ups of symptoms and adjust activity as needed.  Red flags - seek care if you notice:  - Sudden or severe pain - New weakness, numbness, or tingling in the legs - Difficulty walking or loss of balance - New or worsening symptoms that concern you

## 2023-10-30 NOTE — Progress Notes (Signed)
 Primary Care / Sports Medicine Office Visit  Patient Information:  Patient ID: Andrea Schultz, female DOB: [birth year] Age: 63 y.o. MRN: [id number]   Breyon Blass is a pleasant 63 y.o. female presenting with the following:  Chief Complaint  Patient presents with   Back Pain    Patient presents today for a follow up on her low back pain. She has been taking it easy, taking Meloxicam  daily, and methocarbamol  PRN and she her low back pain has improved. Her back is still popping.    Hip Pain    She states her inner muscular hip pain has improved as well, but now her muscles on the outer hip are bothering her.     Vitals:   10/24/23 1507  BP: 92/64  Pulse: 80  SpO2: 98%   Vitals:   10/24/23 1507  Weight: 101 lb (45.8 kg)  Height: 5' 4 (1.626 m)   Body mass index is 17.34 kg/m.  No results found.   Discussed the use of AI scribe software for clinical note transcription with the patient, who gave verbal consent to proceed.   Independent interpretation of notes and tests performed by another provider:   None  Procedures performed:   None  Pertinent History, Exam, Impression, and Recommendations:   Problem List Items Addressed This Visit     Lumbar paraspinal muscle spasm   History of Present Illness Andrea Schultz is a 63 year old female who presents with left-sided iliopsoas and paraspinal muscle tightness.  Left iliopsoas and paraspinal muscle tightness - Ongoing muscle tightness involving the left iliopsoas and paraspinal muscles - Sensation described as 'hugging and snugging' around the spine and crossing to the front of the hip - Previous treatment with anti-inflammatory medication and muscle relaxer provided some improvement - No significant pain or discomfort outside of the muscle issues  Back popping and associated muscle pain - Recent development of a popping sensation in the back during leg extensions - Initially painless, now associated with  muscle pain  Physical activity and exercise modification - Attends core classes, which are beneficial - Plans to return to lower body camp class, modifying exercises to avoid heavy weights and using body weight exercises instead  Medication use for musculoskeletal symptoms - Current regimen includes anti-inflammatory drugs and muscle relaxers, used as needed - Nearly out of anti-inflammatory medication and plans to refill  Physical Exam PALPATION: Non-tender at the left greater trochanter and left tensor fascia lata. Mild tenderness at the left SI joint and mid-substance gluteus medius. Right SI joint and IT band are non-tender. RANGE OF MOTION: Right and left hamstrings exhibit bowstringing at 45 degrees of hip flexion. SPECIAL TESTS: Minor tenderness with FADIR testing on the left. Negative bilirucerol testing on the left.  Assessment and Plan Left iliopsoas and paraspinal muscle strain Improvement noted with previous medications. Persistent painless popping and uneven leg appearance indicate musculoskeletal imbalance. - Switch to as-needed dosing for anti-inflammatory and muscle relaxer medications. - Refer to physical therapy for tailored exercise program. - Advise continuation of physical therapy exercises beyond formal sessions. - Recommend body weight exercises initially, caution against heavy weights.  Left gluteus medius and sacroiliac joint tenderness Mild tenderness suggests compensation for initial back issues. Improvement noted but not fully resolved. - Include gluteus medius and sacroiliac joint in physical therapy treatment plan. - Monitor for symptom flare-ups and adjust activity level accordingly.      Relevant Medications   meloxicam  (MOBIC ) 7.5 MG tablet   Strain  of left iliopsoas muscle   See additional assessment(s) for plan details.      Relevant Medications   meloxicam  (MOBIC ) 7.5 MG tablet     Orders & Medications Medications:  Meds ordered this  encounter  Medications   meloxicam  (MOBIC ) 7.5 MG tablet    Sig: Take 1 tablet (7.5 mg total) by mouth daily as needed for pain.    Dispense:  30 tablet    Refill:  0   No orders of the defined types were placed in this encounter.    No follow-ups on file.     Andrea JINNY Ku, MD, [facility]   Primary Care Sports Medicine Primary Care and Sports Medicine at [facility] [place]

## 2023-12-16 ENCOUNTER — Encounter: Payer: Self-pay | Admitting: Internal Medicine

## 2024-01-27 ENCOUNTER — Ambulatory Visit: Admitting: Internal Medicine

## 2024-01-27 ENCOUNTER — Encounter: Payer: Self-pay | Admitting: Internal Medicine

## 2024-01-27 VITALS — BP 112/62 | HR 103 | Ht 64.0 in | Wt 98.0 lb

## 2024-01-27 DIAGNOSIS — J0100 Acute maxillary sinusitis, unspecified: Secondary | ICD-10-CM | POA: Diagnosis not present

## 2024-01-27 DIAGNOSIS — M25542 Pain in joints of left hand: Secondary | ICD-10-CM | POA: Diagnosis not present

## 2024-01-27 DIAGNOSIS — C439 Malignant melanoma of skin, unspecified: Secondary | ICD-10-CM

## 2024-01-27 MED ORDER — AZITHROMYCIN 250 MG PO TABS: ORAL_TABLET | ORAL | 0 refills | Status: AC

## 2024-01-27 NOTE — Assessment & Plan Note (Signed)
 Continue q 6 mo Dermatology evaluations

## 2024-01-27 NOTE — Progress Notes (Addendum)
 Date:  01/27/2024   Name:  Andrea Schultz   DOB:  [birth year]   MRN:  [id number]   Chief Complaint: Cough (X10 days; with sinus congestion; reports slight nausea, denies fever/chills/emesis/diarrhea) and Hand Pain (Left thumb, injured yesterday, minimal swelling, no bruising, decreased ROM)  Cough This is a new problem. Episode onset: 10 days ago. The cough is Non-productive. Associated symptoms include nasal congestion. Pertinent negatives include no chest pain, chills, ear congestion, postnasal drip, sore throat, shortness of breath, weight loss or wheezing. Nothing aggravates the symptoms. Treatments tried: oral decongestant. The treatment provided mild relief.  Hand Pain  The incident occurred 12 to 24 hours ago. The incident occurred at home. Injury mechanism: lifting luggage. Pain location: left thumb. The quality of the pain is described as aching. The pain does not radiate. Pertinent negatives include no chest pain.    Review of Systems  Constitutional:  Negative for chills and weight loss.  HENT:  Negative for postnasal drip and sore throat.   Respiratory:  Positive for cough. Negative for shortness of breath and wheezing.   Cardiovascular:  Negative for chest pain.  Musculoskeletal:  Positive for arthralgias.     Lab Results  Component Value Date   NA 140 05/22/2023   K 5.0 05/22/2023   CO2 24 05/22/2023   GLUCOSE 80 05/22/2023   BUN 15 05/22/2023   CREATININE 0.85 05/22/2023   CALCIUM 9.3 05/22/2023   EGFR 76 05/22/2023   GFRNONAA 69 03/30/2020   Lab Results  Component Value Date   CHOL 206 (H) 05/22/2023   HDL 104 05/22/2023   LDLCALC 93 05/22/2023   TRIG 49 05/22/2023   CHOLHDL 2.0 05/22/2023   Lab Results  Component Value Date   TSH 3.860 05/22/2023   Lab Results  Component Value Date   HGBA1C 5.4 05/11/2021   Lab Results  Component Value Date   WBC 3.3 (L) 05/22/2023   HGB 13.6 05/22/2023   HCT 39.9 05/22/2023   MCV 88 05/22/2023   PLT 206  05/22/2023   Lab Results  Component Value Date   ALT 22 05/22/2023   AST 25 05/22/2023   ALKPHOS 66 05/22/2023   BILITOT 0.6 05/22/2023   Lab Results  Component Value Date   VD25OH 25.4 (L) 05/22/2023     Patient Active Problem List   Diagnosis Date Noted   Strain of left iliopsoas muscle 09/12/2023   Lumbar paraspinal muscle spasm 09/12/2023   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  Active Medications[3]     05/22/2023    8:08 AM 05/15/2022    8:00 AM 05/11/2021    9:18 AM 12/14/2020    3:13 PM  GAD 7 : Generalized Anxiety Score  Nervous, Anxious, on Edge 0 0 0 0  Control/stop worrying 0 0 0 0  Worry too much - different things 0 0 0 0  Trouble relaxing 0 0 0 0  Restless 0 0 0  0  Easily annoyed or irritable 0 0 0 0  Afraid - awful might happen 0 0 0 0  Total GAD 7 Score 0 0 0 0  Anxiety Difficulty Not difficult at all Not difficult at all  Not difficult at all       10/24/2023    3:10 PM 09/12/2023    1:18 PM 05/22/2023    8:08 AM  Depression screen PHQ 2/9  Decreased Interest 0 0 0  Down, Depressed, Hopeless 0 0 0  PHQ - 2 Score 0 0 0  Altered sleeping   0  Tired, decreased energy   0  Change in appetite   0  Feeling bad or failure about yourself    0  Trouble concentrating   0  Moving slowly or fidgety/restless   0  Suicidal thoughts   0  PHQ-9 Score   0   Difficult doing work/chores   Not difficult at  all     Data saved with a previous flowsheet row definition    BP Readings from Last 3 Encounters:  01/27/24 112/62  10/24/23 92/64  09/12/23 (!) 104/58    Physical Exam Constitutional:      Appearance: Normal appearance.  HENT:     Right Ear: Tympanic membrane normal.     Left Ear: Tympanic membrane normal.     Nose:     Right Sinus: Maxillary sinus tenderness present.     Left Sinus: Maxillary sinus tenderness present.     Mouth/Throat:     Pharynx: Oropharynx is clear. No posterior oropharyngeal erythema.  Neck:     Vascular: No carotid bruit.  Cardiovascular:     Rate and Rhythm: Normal rate and regular rhythm.     Heart sounds: No murmur heard. Pulmonary:     Effort: No respiratory distress.     Breath sounds: No wheezing or rhonchi.  Musculoskeletal:     Cervical back: Normal range of motion.     Comments: Tender base of left thumb Thenar atrophy noted - no swelling or bruising Grip intact  Lymphadenopathy:     Cervical: Cervical adenopathy present.  Neurological:     Mental Status: She is alert.     Wt Readings from Last 3 Encounters:  01/27/24 98 lb (44.5 kg)  10/24/23 101 lb (45.8 kg)  09/12/23 103 lb 3.2 oz (46.8 kg)    BP 112/62   Pulse (!) 103   Ht 5' 4 (1.626 m)   Wt 98 lb (44.5 kg)   SpO2 96%   BMI 16.82 kg/m   Assessment and Plan:  Problem List Items Addressed This Visit       Unprioritized   Skin cancer (melanoma) (HCC)   Continue q 6 mo Dermatology evaluations      Relevant Medications   azithromycin  (ZITHROMAX  Z-PAK) 250 MG tablet   Other Visit Diagnoses       Acute non-recurrent maxillary sinusitis    -  Primary   Recommend Flonase NS or similar since sudafed not tolerated push fluids; tylenol if needed   Relevant Medications   azithromycin  (ZITHROMAX  Z-PAK) 250 MG tablet     Pain in thumb joint with movement of left hand       suspect simple strain use Tylenol as needed; topical rubs qid and rest       No  follow-ups on file.    Leita HILARIO Adie, MD [facility] [place]           [  1]  Allergies Allergen Reactions   Sulfa Antibiotics Hives, Itching and Swelling  [2]  Social History Tobacco Use   Smoking status: Former    Current packs/day: 0.00    Average packs/day: 0.3 packs/day for 0.5 years (0.1 ttl pk-yrs)    Types: Cigarettes    Start date: 02/13/1983    Quit date: 07/14/1983    Years since quitting: 40.5   Smokeless tobacco: Never  Vaping Use   Vaping status: Never Used  Substance Use Topics   Alcohol use: Yes    Alcohol/week: 10.0 standard drinks of alcohol    Types: 10 Standard drinks or equivalent per week   Drug use: Never  [3]  Current Meds  Medication Sig   azithromycin  (ZITHROMAX  Z-PAK) 250 MG tablet UAD   Fexofenadine HCl (ALLEGRA PO) Take by mouth in the morning and at bedtime.   meloxicam  (MOBIC ) 7.5 MG tablet Take 1 tablet (7.5 mg total) by mouth daily as needed for pain.   methocarbamol  (ROBAXIN ) 500 MG tablet Take 1 tablet (500 mg total) by mouth every 8 (eight) hours as needed for muscle spasms.

## 2024-01-27 NOTE — Patient Instructions (Signed)
 Flonase/fluticasone nasal spray  - can also use Nasocort spray

## 2024-02-19 ENCOUNTER — Encounter: Payer: Self-pay | Admitting: Podiatry

## 2024-02-19 ENCOUNTER — Ambulatory Visit: Admitting: Podiatry

## 2024-02-19 DIAGNOSIS — D2372 Other benign neoplasm of skin of left lower limb, including hip: Secondary | ICD-10-CM

## 2024-02-19 DIAGNOSIS — D2371 Other benign neoplasm of skin of right lower limb, including hip: Secondary | ICD-10-CM | POA: Diagnosis not present

## 2024-02-19 NOTE — Progress Notes (Signed)
 She presents to the [place] office today after having not seen her since February of last year.  She states that the last time we trimmed those out it worked really well.  She is referring to the painful lesions to the plantar aspect of the bilateral foot.  Objective: Vital signs are stable she is alert and oriented x 3.  Pulses are palpable.  She has solitary porokeratotic lesions plantar aspect of the second metatarsal linearly and 1 solitary lesion subfifth metatarsal neck left foot.  There is no erythema edema cellulitis drainage or odor these are only tender with deep palpation and medial-lateral compression.  Assessment: Benign skin lesions plantar aspect bilateral foot.  Plan: Manual debridement of benign skin lesions today by mechanical means of the remaining lesion she will follow-up with us  on an as-needed basis.

## 2024-05-25 ENCOUNTER — Ambulatory Visit: Admitting: Student
